# Patient Record
Sex: Male | Born: 1955 | Race: White | Hispanic: No | Marital: Married | Smoking: Former smoker
Health system: Southern US, Community
[De-identification: ages and names within clinical notes are randomized; demographics above are authoritative.]

## PROBLEM LIST (undated history)

## (undated) DIAGNOSIS — I10 Essential (primary) hypertension: Secondary | ICD-10-CM

## (undated) DIAGNOSIS — Z720 Tobacco use: Secondary | ICD-10-CM

## (undated) DIAGNOSIS — I350 Nonrheumatic aortic (valve) stenosis: Secondary | ICD-10-CM

## (undated) DIAGNOSIS — E1129 Type 2 diabetes mellitus with other diabetic kidney complication: Secondary | ICD-10-CM

## (undated) DIAGNOSIS — E7800 Pure hypercholesterolemia, unspecified: Secondary | ICD-10-CM

## (undated) DIAGNOSIS — B351 Tinea unguium: Secondary | ICD-10-CM

## (undated) DIAGNOSIS — F1011 Alcohol abuse, in remission: Secondary | ICD-10-CM

## (undated) DIAGNOSIS — I2510 Atherosclerotic heart disease of native coronary artery without angina pectoris: Secondary | ICD-10-CM

## (undated) HISTORY — DX: Alcohol abuse, in remission: F10.11

## (undated) HISTORY — PX: NO PAST SURGERIES: SHX2092

## (undated) HISTORY — DX: Essential (primary) hypertension: I10

## (undated) HISTORY — DX: Pure hypercholesterolemia, unspecified: E78.00

## (undated) HISTORY — DX: Type 2 diabetes mellitus with other diabetic kidney complication: E11.29

## (undated) HISTORY — DX: Tinea unguium: B35.1

## (undated) HISTORY — DX: Atherosclerotic heart disease of native coronary artery without angina pectoris: I25.10

---

## 2015-10-03 DIAGNOSIS — R5383 Other fatigue: Secondary | ICD-10-CM | POA: Diagnosis not present

## 2015-10-03 DIAGNOSIS — L309 Dermatitis, unspecified: Secondary | ICD-10-CM | POA: Diagnosis not present

## 2015-10-03 DIAGNOSIS — R05 Cough: Secondary | ICD-10-CM | POA: Diagnosis not present

## 2015-10-03 DIAGNOSIS — I10 Essential (primary) hypertension: Secondary | ICD-10-CM | POA: Diagnosis not present

## 2015-10-03 DIAGNOSIS — Z1389 Encounter for screening for other disorder: Secondary | ICD-10-CM | POA: Diagnosis not present

## 2015-10-13 DIAGNOSIS — R7301 Impaired fasting glucose: Secondary | ICD-10-CM | POA: Diagnosis not present

## 2015-11-14 DIAGNOSIS — Z125 Encounter for screening for malignant neoplasm of prostate: Secondary | ICD-10-CM | POA: Diagnosis not present

## 2015-11-14 DIAGNOSIS — E7800 Pure hypercholesterolemia, unspecified: Secondary | ICD-10-CM | POA: Diagnosis not present

## 2015-11-14 DIAGNOSIS — R7301 Impaired fasting glucose: Secondary | ICD-10-CM | POA: Diagnosis not present

## 2016-02-05 DIAGNOSIS — E119 Type 2 diabetes mellitus without complications: Secondary | ICD-10-CM | POA: Diagnosis not present

## 2016-02-09 DIAGNOSIS — Z0000 Encounter for general adult medical examination without abnormal findings: Secondary | ICD-10-CM | POA: Diagnosis not present

## 2016-02-09 DIAGNOSIS — Z2821 Immunization not carried out because of patient refusal: Secondary | ICD-10-CM | POA: Diagnosis not present

## 2016-07-22 DIAGNOSIS — Z6821 Body mass index (BMI) 21.0-21.9, adult: Secondary | ICD-10-CM | POA: Diagnosis not present

## 2016-07-22 DIAGNOSIS — E7439 Other disorders of intestinal carbohydrate absorption: Secondary | ICD-10-CM | POA: Diagnosis not present

## 2016-07-22 DIAGNOSIS — F17200 Nicotine dependence, unspecified, uncomplicated: Secondary | ICD-10-CM | POA: Diagnosis not present

## 2016-07-22 DIAGNOSIS — R899 Unspecified abnormal finding in specimens from other organs, systems and tissues: Secondary | ICD-10-CM | POA: Diagnosis not present

## 2016-07-22 DIAGNOSIS — I10 Essential (primary) hypertension: Secondary | ICD-10-CM | POA: Diagnosis not present

## 2017-02-17 DIAGNOSIS — Z1331 Encounter for screening for depression: Secondary | ICD-10-CM | POA: Diagnosis not present

## 2017-02-17 DIAGNOSIS — Z1339 Encounter for screening examination for other mental health and behavioral disorders: Secondary | ICD-10-CM | POA: Diagnosis not present

## 2017-02-17 DIAGNOSIS — Z0000 Encounter for general adult medical examination without abnormal findings: Secondary | ICD-10-CM | POA: Diagnosis not present

## 2017-02-17 DIAGNOSIS — Z6822 Body mass index (BMI) 22.0-22.9, adult: Secondary | ICD-10-CM | POA: Diagnosis not present

## 2017-02-25 DIAGNOSIS — R899 Unspecified abnormal finding in specimens from other organs, systems and tissues: Secondary | ICD-10-CM | POA: Diagnosis not present

## 2017-02-25 DIAGNOSIS — E119 Type 2 diabetes mellitus without complications: Secondary | ICD-10-CM | POA: Diagnosis not present

## 2017-09-21 DIAGNOSIS — Z01818 Encounter for other preprocedural examination: Secondary | ICD-10-CM | POA: Diagnosis not present

## 2017-10-06 DIAGNOSIS — K5730 Diverticulosis of large intestine without perforation or abscess without bleeding: Secondary | ICD-10-CM | POA: Diagnosis not present

## 2017-10-06 DIAGNOSIS — Z1211 Encounter for screening for malignant neoplasm of colon: Secondary | ICD-10-CM | POA: Diagnosis not present

## 2017-10-06 DIAGNOSIS — Z7982 Long term (current) use of aspirin: Secondary | ICD-10-CM | POA: Diagnosis not present

## 2017-10-06 DIAGNOSIS — D124 Benign neoplasm of descending colon: Secondary | ICD-10-CM | POA: Diagnosis not present

## 2017-10-06 DIAGNOSIS — D128 Benign neoplasm of rectum: Secondary | ICD-10-CM | POA: Diagnosis not present

## 2017-10-06 DIAGNOSIS — D122 Benign neoplasm of ascending colon: Secondary | ICD-10-CM | POA: Diagnosis not present

## 2017-10-06 DIAGNOSIS — K648 Other hemorrhoids: Secondary | ICD-10-CM | POA: Diagnosis not present

## 2017-10-06 DIAGNOSIS — F17210 Nicotine dependence, cigarettes, uncomplicated: Secondary | ICD-10-CM | POA: Diagnosis not present

## 2017-10-06 DIAGNOSIS — I10 Essential (primary) hypertension: Secondary | ICD-10-CM | POA: Diagnosis not present

## 2017-10-06 DIAGNOSIS — Z800 Family history of malignant neoplasm of digestive organs: Secondary | ICD-10-CM | POA: Diagnosis not present

## 2017-10-06 DIAGNOSIS — K635 Polyp of colon: Secondary | ICD-10-CM | POA: Diagnosis not present

## 2018-02-20 DIAGNOSIS — Z0000 Encounter for general adult medical examination without abnormal findings: Secondary | ICD-10-CM | POA: Diagnosis not present

## 2018-02-20 DIAGNOSIS — Z6821 Body mass index (BMI) 21.0-21.9, adult: Secondary | ICD-10-CM | POA: Diagnosis not present

## 2018-02-21 DIAGNOSIS — E119 Type 2 diabetes mellitus without complications: Secondary | ICD-10-CM | POA: Diagnosis not present

## 2018-02-21 DIAGNOSIS — Z0000 Encounter for general adult medical examination without abnormal findings: Secondary | ICD-10-CM | POA: Diagnosis not present

## 2018-03-03 DIAGNOSIS — I10 Essential (primary) hypertension: Secondary | ICD-10-CM | POA: Diagnosis not present

## 2018-03-03 DIAGNOSIS — E1165 Type 2 diabetes mellitus with hyperglycemia: Secondary | ICD-10-CM | POA: Diagnosis not present

## 2018-03-03 DIAGNOSIS — Z1331 Encounter for screening for depression: Secondary | ICD-10-CM | POA: Diagnosis not present

## 2018-03-03 DIAGNOSIS — Z6821 Body mass index (BMI) 21.0-21.9, adult: Secondary | ICD-10-CM | POA: Diagnosis not present

## 2018-05-01 DIAGNOSIS — E1165 Type 2 diabetes mellitus with hyperglycemia: Secondary | ICD-10-CM | POA: Diagnosis not present

## 2018-10-09 DIAGNOSIS — Z79899 Other long term (current) drug therapy: Secondary | ICD-10-CM | POA: Diagnosis not present

## 2018-10-09 DIAGNOSIS — I10 Essential (primary) hypertension: Secondary | ICD-10-CM | POA: Diagnosis not present

## 2018-10-09 DIAGNOSIS — E7800 Pure hypercholesterolemia, unspecified: Secondary | ICD-10-CM | POA: Diagnosis not present

## 2018-10-09 DIAGNOSIS — E7439 Other disorders of intestinal carbohydrate absorption: Secondary | ICD-10-CM | POA: Diagnosis not present

## 2018-10-09 DIAGNOSIS — F17200 Nicotine dependence, unspecified, uncomplicated: Secondary | ICD-10-CM | POA: Diagnosis not present

## 2018-10-09 DIAGNOSIS — Z6820 Body mass index (BMI) 20.0-20.9, adult: Secondary | ICD-10-CM | POA: Diagnosis not present

## 2018-10-09 DIAGNOSIS — L309 Dermatitis, unspecified: Secondary | ICD-10-CM | POA: Diagnosis not present

## 2020-06-23 ENCOUNTER — Encounter: Payer: Self-pay | Admitting: Cardiology

## 2020-07-24 NOTE — Progress Notes (Signed)
Cardiology Office Note:    Date:  07/25/2020   ID:  [name], DOB 06-13-1955, MRN [id number]  PCP:  Robert Saupe, MD  Cardiologist:  Norman Herrlich, MD   Referring MD: Robert Saupe, MD  ASSESSMENT:    1. Aortic valve calcification   2. Coronary artery calcification seen on CT scan   3. Thoracic aorta atherosclerosis (HCC)    PLAN:    In order of problems listed above:  1. Clinically he has a murmur through his adulthood clearly has aortic stenosis and likely a bicuspid aortic valve he is asymptomatic and the physical exam does not have characteristics of severe stenosis.  I have asked him to have an echocardiogram performed to define his valvular pathology severity and also to look at his thoracic aorta.  Unless he had critical aortic stenosis greater than 5 m velocity there will be no indication for intervention at this time. 2. He has had increased cardiovascular risk with ongoing cigarette smoking diabetes hypertension hyperlipidemia and has what is described as severe coronary artery calcification.  He tells me he had a stress test decades ago that was normal.  I think he should have an ischemia evaluation I think he is best served by cardiac CTA.  He understands there is a worldwide dye shortage and does not have a problem waiting until we are out of this timeframe and will be set up electively as an outpatient and he will follow-up with me a few weeks afterwards. 3. His diabetes hypertension hyperlipidemia are well controlled on appropriate cardioprotective medications continue the same.  Next appointment 2 weeks after CTA   Medication Adjustments/Labs and Tests Ordered: Current medicines are reviewed at length with the patient today.  Concerns regarding medicines are outlined above.  No orders of the defined types were placed in this encounter.  No orders of the defined types were placed in this encounter.    I had a screening CT scan for lung cancer and  was found to have a heavily calcified aortic valve and vascular calcification.  History of Present Illness:    Robert Li is a 65 y.o. male with a history of hypertension hyperlipidemia and type 2 diabetes who is being seen today for the evaluation of abnormal lung screening CT of chest at the request of Robert Saupe, MD.  Recent labs from his PCP 06/13/2020 CMP shows GFR greater than 60 cc sodium 138 potassium 4.2 A1c 7.2% hemoglobin normal 15.9 platelets 197,000 Lipid profile 11/23/2019 shows a cholesterol 119 triglycerides 137 HDL 51 on a high intensity statin  Office note from 06/12/2020 reviewed.  He had a CT of the chest lung cancer screening performed 06/18/2020 showing severe calcification of the aortic valve with concerns of aortic stenosis and diffuse coronary and aortic atheroosclerosis as well as findings of emphysema.  He has been very apprehensive pending this evaluation. He tells me has had a heart murmur for about 30 years but no known history of congenital or rheumatic heart disease He is a vigorous active man no angina chest pain shortness of breath palpitation or syncope. Recently wore a 1 week continuous glucose monitor and tells me he is tightly controlled He is on lipid-lowering therapy and on appropriate cardioprotective treatment for diabetes with SGLT2 inhibitor metformin and a ARB for hyper tension. He has no family history of valvular heart disease or aortopathy.  Past Medical History:  Diagnosis Date  . Benign essential hypertension   .  Diabetes mellitus with renal complications (HCC)   . History of alcohol abuse   . Hypercholesterolemia   . Onychomycosis     Past Surgical History:  Procedure Laterality Date  . NO PAST SURGERIES      Current Medications: Current Meds  Medication Sig  . amLODipine (NORVASC) 10 MG tablet Take 5 mg by mouth daily.  Marland Kitchen JARDIANCE 10 MG TABS tablet Take 10 mg by mouth daily.  . metFORMIN (GLUCOPHAGE) 500 MG  tablet Take 1 tablet by mouth 2 (two) times daily.  . rosuvastatin (CRESTOR) 5 MG tablet Take 5 mg by mouth at bedtime.  Marland Kitchen telmisartan (MICARDIS) 40 MG tablet Take 40 mg by mouth daily.     Allergies:   Altace [ramipril], Hctz [hydrochlorothiazide], and Wellbutrin [bupropion]   Social History   Socioeconomic History  . Marital status: Unknown    Spouse name: Not on file  . Number of children: Not on file  . Years of education: Not on file  . Highest education level: Not on file  Occupational History  . Not on file  Tobacco Use  . Smoking status: Current Every Day Smoker    Packs/day: 1.00  . Smokeless tobacco: Never Used  Substance and Sexual Activity  . Alcohol use: Yes    Alcohol/week: 35.0 standard drinks    Types: 35 Cans of beer per week  . Drug use: Never  . Sexual activity: Not on file  Other Topics Concern  . Not on file  Social History Narrative  . Not on file   Social Determinants of Health   Financial Resource Strain: Not on file  Food Insecurity: Not on file  Transportation Needs: Not on file  Physical Activity: Not on file  Stress: Not on file  Social Connections: Not on file     Family History: The patient's family history includes Alzheimer's disease in his mother; Colon cancer in his father; Diabetes in his father; Hypertension in his father.  ROS:   ROS Please see the history of present illness.     All other systems reviewed and are negative.  He does have urinary frequency and nocturia  EKGs/Labs/Other Studies Reviewed:    The following studies were reviewed today:   EKG:  EKG is  ordered today.  The ekg ordered today is personally reviewed and demonstrates sinus rhythm left axis deviation nonspecific T waves no pattern of infarction   Physical Exam:    VS:  BP 130/80 (BP Location: Left Arm, Patient Position: Sitting, Cuff Size: Normal)   Pulse 89   Ht 5\' 8"  (1.727 m)   Wt 139 lb (63 kg)   SpO2 98%   BMI 21.13 kg/m     Wt Readings  from Last 3 Encounters:  07/25/20 139 lb (63 kg)  06/12/20 147 lb (66.7 kg)     GEN: He has mild scoliosis and pectus excavatum deformity well nourished, well developed in no acute distress HEENT: Normal NECK: No JVD; No carotid bruits LYMPHATICS: No lymphadenopathy CARDIAC: He has a grade 2/6 to 3/6 systolic ejection murmur aortic area right clavicle does not radiate to the carotids S2 still splits no aortic regurgitation RRR, no , rubs, gallops RESPIRATORY:  Clear to auscultation without rales, wheezing or rhonchi  ABDOMEN: Soft, non-tender, non-distended MUSCULOSKELETAL:  No edema; No deformity  SKIN: Warm and dry NEUROLOGIC:  Alert and oriented x 3 PSYCHIATRIC:  Normal affect     Signed, 06/14/20, MD  07/25/2020 8:44 AM    [facility]

## 2020-07-25 ENCOUNTER — Encounter: Payer: Self-pay | Admitting: Cardiology

## 2020-07-25 ENCOUNTER — Other Ambulatory Visit: Payer: Self-pay

## 2020-07-25 ENCOUNTER — Ambulatory Visit (INDEPENDENT_AMBULATORY_CARE_PROVIDER_SITE_OTHER): Payer: Medicare Other | Admitting: Cardiology

## 2020-07-25 VITALS — BP 130/80 | HR 89 | Ht 68.0 in | Wt 139.0 lb

## 2020-07-25 DIAGNOSIS — I2510 Atherosclerotic heart disease of native coronary artery without angina pectoris: Secondary | ICD-10-CM

## 2020-07-25 DIAGNOSIS — I359 Nonrheumatic aortic valve disorder, unspecified: Secondary | ICD-10-CM

## 2020-07-25 DIAGNOSIS — I700 Atherosclerosis of aorta: Secondary | ICD-10-CM

## 2020-07-25 MED ORDER — METOPROLOL TARTRATE 100 MG PO TABS: 100.0000 mg | ORAL_TABLET | Freq: Once | ORAL | 0 refills | Status: DC

## 2020-07-25 NOTE — Patient Instructions (Addendum)
Medication Instructions:  Your physician recommends that you continue on your current medications as directed. Please refer to the Current Medication list given to you today.  *If you need a refill on your cardiac medications before your next appointment, please call your pharmacy*   Lab Work: Your physician recommends that you return for lab work in: WITHIN ONE WEEK OF YOUR CARDIAC CT  BMP  If you have labs (blood work) drawn today and your tests are completely normal, you will receive your results only by: Marland Kitchen MyChart Message (if you have MyChart) OR . A paper copy in the mail If you have any lab test that is abnormal or we need to change your treatment, we will call you to review the results.   Testing/Procedures: Your physician has requested that you have an echocardiogram. Echocardiography is a painless test that uses sound waves to create images of your heart. It provides your doctor with information about the size and shape of your heart and how well your heart's chambers and valves are working. This procedure takes approximately one hour. There are no restrictions for this procedure.  Your cardiac CT will be scheduled at the below location:   [facility] [address] [place], [place] [address] [phone]  If scheduled at [facility], please arrive at the [facility] main entrance (entrance A) of [facility] 30 minutes prior to test start time. Proceed to the [facility] Radiology Department (first floor) to check-in and test prep.  Please follow these instructions carefully (unless otherwise directed):  On the Night Before the Test: . Be sure to Drink plenty of water. . Do not consume any caffeinated/decaffeinated beverages or chocolate 12 hours prior to your test. . Do not take any antihistamines 12 hours prior to your test.  On the Day of the Test: . Drink plenty of water until 1 hour prior to the test. . Do not eat any food 4 hours  prior to the test. . You may take your regular medications prior to the test.  . Take metoprolol (Lopressor) two hours prior to test.      After the Test: . Drink plenty of water. . After receiving IV contrast, you may experience a mild flushed feeling. This is normal. . On occasion, you may experience a mild rash up to 24 hours after the test. This is not dangerous. If this occurs, you can take Benadryl 25 mg and increase your fluid intake. . If you experience trouble breathing, this can be serious. If it is severe call 911 IMMEDIATELY. If it is mild, please call our office. . If you take any of these medications: Glipizide/Metformin, Avandament, Glucavance, please do not take 48 hours after completing test unless otherwise instructed.   Once we have confirmed authorization from your insurance company, we will call you to set up a date and time for your test. Based on how quickly your insurance processes prior authorizations requests, please allow up to 4 weeks to be contacted for scheduling your Cardiac CT appointment. Be advised that routine Cardiac CT appointments could be scheduled as many as 8 weeks after your provider has ordered it.  For non-scheduling related questions, please contact the cardiac imaging nurse navigator should you have any questions/concerns: Rockwell Alexandria, Cardiac Imaging Nurse Navigator Larey Brick, Cardiac Imaging Nurse Navigator  Heart and Vascular Services Direct Office Dial: [phone]   For scheduling needs, including cancellations and rescheduling, please call [place], [phone].  Follow-Up: At [facility], you and your health needs are our priority.  As part of our continuing mission to provide you with exceptional heart care, we have created designated Provider Care Teams.  These Care Teams include your primary Cardiologist (physician) and Advanced Practice Providers (APPs -  Physician Assistants and Nurse Practitioners) who all  work together to provide you with the care you need, when you need it.  We recommend signing up for the patient portal called "MyChart".  Sign up information is provided on this After Visit Summary.  MyChart is used to connect with patients for Virtual Visits (Telemedicine).  Patients are able to view lab/test results, encounter notes, upcoming appointments, etc.  Non-urgent messages can be sent to your provider as well.   To learn more about what you can do with MyChart, go to [email or web address].    Your next appointment:   2 week(s) after cardiac CT  The format for your next appointment:   In Person  Provider:   Norman Herrlich, MD   Other Instructions

## 2020-08-08 ENCOUNTER — Telehealth (HOSPITAL_COMMUNITY): Payer: Self-pay | Admitting: Emergency Medicine

## 2020-08-08 ENCOUNTER — Other Ambulatory Visit: Payer: Self-pay

## 2020-08-08 DIAGNOSIS — I359 Nonrheumatic aortic valve disorder, unspecified: Secondary | ICD-10-CM

## 2020-08-08 DIAGNOSIS — I2510 Atherosclerotic heart disease of native coronary artery without angina pectoris: Secondary | ICD-10-CM

## 2020-08-08 DIAGNOSIS — I700 Atherosclerosis of aorta: Secondary | ICD-10-CM

## 2020-08-08 NOTE — Telephone Encounter (Signed)
Reaching out to patient to offer assistance regarding upcoming cardiac imaging study; pt verbalizes understanding of appt date/time, parking situation and where to check in, pre-test NPO status and medications ordered, and verified current allergies; name and call back number provided for further questions should they arise Rockwell Alexandria RN Navigator Cardiac Imaging [facility] [phone] office [phone] cell   100mg  metoprolol tart 2 hr prior to scan Pt reports he had labs drawn at [facility] office today 08/08/20  08/10/20

## 2020-08-09 LAB — BASIC METABOLIC PANEL
BUN/Creatinine Ratio: 19 (ref 10–24)
BUN: 15 mg/dL (ref 8–27)
CO2: 18 mmol/L — ABNORMAL LOW (ref 20–29)
Calcium: 9 mg/dL (ref 8.6–10.2)
Chloride: 103 mmol/L (ref 96–106)
Creatinine, Ser: 0.8 mg/dL (ref 0.76–1.27)
Glucose: 115 mg/dL — ABNORMAL HIGH (ref 65–99)
Potassium: 4 mmol/L (ref 3.5–5.2)
Sodium: 141 mmol/L (ref 134–144)
eGFR: 98 mL/min/{1.73_m2} (ref >59)

## 2020-08-11 ENCOUNTER — Telehealth: Payer: Self-pay

## 2020-08-11 NOTE — Telephone Encounter (Signed)
-----   Message from Brian J Munley, MD sent at 08/10/2020  5:28 PM EDT ----- Normal or stable result  No changes 

## 2020-08-11 NOTE — Telephone Encounter (Signed)
Spoke with patient regarding results and recommendation.  Patient verbalizes understanding and is agreeable to plan of care. Advised patient to call back with any issues or concerns.  

## 2020-08-12 ENCOUNTER — Other Ambulatory Visit: Payer: Self-pay

## 2020-08-12 ENCOUNTER — Ambulatory Visit (HOSPITAL_COMMUNITY)
Admission: RE | Admit: 2020-08-12 | Discharge: 2020-08-12 | Disposition: A | Discharge status: Home / Self Care | Payer: Medicare Other | Source: Ambulatory Visit | Attending: Cardiology | Admitting: Cardiology

## 2020-08-12 DIAGNOSIS — I700 Atherosclerosis of aorta: Secondary | ICD-10-CM | POA: Diagnosis present

## 2020-08-12 DIAGNOSIS — I2510 Atherosclerotic heart disease of native coronary artery without angina pectoris: Secondary | ICD-10-CM | POA: Diagnosis not present

## 2020-08-12 DIAGNOSIS — I359 Nonrheumatic aortic valve disorder, unspecified: Secondary | ICD-10-CM | POA: Diagnosis not present

## 2020-08-12 MED ORDER — NITROGLYCERIN 0.4 MG SL SUBL
0.8000 mg | SUBLINGUAL_TABLET | Freq: Once | SUBLINGUAL | Status: AC
  Administered 2020-08-12: 0.8 mg via SUBLINGUAL

## 2020-08-12 MED ORDER — NITROGLYCERIN 0.4 MG SL SUBL
SUBLINGUAL_TABLET | SUBLINGUAL | Status: AC
  Filled 2020-08-12: qty 2

## 2020-08-12 MED ORDER — IOHEXOL 350 MG/ML SOLN
80.0000 mL | Freq: Once | INTRAVENOUS | Status: AC | PRN
  Administered 2020-08-12: 80 mL via INTRAVENOUS

## 2020-08-14 ENCOUNTER — Telehealth: Payer: Self-pay

## 2020-08-14 NOTE — Telephone Encounter (Signed)
Tried calling patient. No answer and no voicemail set up for me to leave a message. 

## 2020-08-14 NOTE — Telephone Encounter (Signed)
-----   Message from Thomasene Ripple, DO sent at 08/14/2020 10:59 AM EDT ----- This is Dr. Hulen Shouts patient, please schedule patient to see Dr. Dulce Sellar upon his return.

## 2020-08-15 ENCOUNTER — Other Ambulatory Visit: Payer: Self-pay

## 2020-08-15 ENCOUNTER — Ambulatory Visit (INDEPENDENT_AMBULATORY_CARE_PROVIDER_SITE_OTHER): Payer: Medicare Other

## 2020-08-15 ENCOUNTER — Telehealth: Payer: Self-pay

## 2020-08-15 DIAGNOSIS — I2510 Atherosclerotic heart disease of native coronary artery without angina pectoris: Secondary | ICD-10-CM

## 2020-08-15 DIAGNOSIS — I359 Nonrheumatic aortic valve disorder, unspecified: Secondary | ICD-10-CM

## 2020-08-15 DIAGNOSIS — I700 Atherosclerosis of aorta: Secondary | ICD-10-CM

## 2020-08-15 LAB — ECHOCARDIOGRAM COMPLETE
AR max vel: 0.39 cm2
AV Area VTI: 0.48 cm2
AV Area mean vel: 0.35 cm2
AV Mean grad: 87 mmHg
AV Peak grad: 125.9 mmHg
Ao pk vel: 5.61 m/s
Area-P 1/2: 4.99 cm2
S' Lateral: 2.9 cm

## 2020-08-15 NOTE — Telephone Encounter (Signed)
Spoke with patient regarding results and recommendation.  Patient verbalizes understanding and is agreeable to plan of care. Advised patient to call back with any issues or concerns.  

## 2020-08-15 NOTE — Telephone Encounter (Signed)
-----   Message from Kardie Tobb, DO sent at 08/14/2020 10:59 AM EDT ----- This is Dr. Munley's patient, please schedule patient to see Dr. Munley upon his return. 

## 2020-08-18 DIAGNOSIS — I119 Hypertensive heart disease without heart failure: Secondary | ICD-10-CM

## 2020-08-18 DIAGNOSIS — E1129 Type 2 diabetes mellitus with other diabetic kidney complication: Secondary | ICD-10-CM | POA: Insufficient documentation

## 2020-08-18 DIAGNOSIS — F1011 Alcohol abuse, in remission: Secondary | ICD-10-CM | POA: Insufficient documentation

## 2020-08-18 DIAGNOSIS — E7800 Pure hypercholesterolemia, unspecified: Secondary | ICD-10-CM | POA: Insufficient documentation

## 2020-08-18 DIAGNOSIS — B351 Tinea unguium: Secondary | ICD-10-CM | POA: Insufficient documentation

## 2020-08-18 HISTORY — DX: Hypertensive heart disease without heart failure: I11.9

## 2020-08-18 NOTE — Progress Notes (Addendum)
Cardiology Office Note:    Date:  08/26/2020   ID:  Robert Li, DOB [birth year], MRN [id number]  PCP:  Robert Saupe, MD  Cardiologist:  Robert Herrlich, MD    Referring MD: Robert Saupe, MD    ASSESSMENT:    1. Nonrheumatic aortic valve stenosis   2. High coronary artery calcium score   3. Coronary artery disease of native artery of native heart with stable angina pectoris (HCC)   4. Hypertensive heart disease without heart failure   5. Hypercholesterolemia    PLAN:     He phoned my office this morning 08/26/2020 and would like to have his care [facility] and referred for right and left heart catheterization with critical aortic stenosis.   In order of problems listed above:  He has critical aortic stenosis very high calcium score and CAD which appears to be not severe.  I advised him to undergo evaluation right and left heart catheterization in preparation for valvular intervention.  Options benefits and risks detailed.  The family make a decision if they want to be referred to the [facility]  I asked them not to delay. Continue statin with CAD and high calcium score Continue his ARB for hypertension Continue oral agents for diabetes    Next appointment: To be determined if referred to [facility] to follow-up with that organization   Medication Adjustments/Labs and Tests Ordered: Current medicines are reviewed at length with the patient today.  Concerns regarding medicines are outlined above.  No orders of the defined types were placed in this encounter.  No orders of the defined types were placed in this encounter.   Chief Complaint  Patient presents with   Follow-up    After cardiac CTA and echocardiogram revealing critical aortic stenosis     History of Present Illness:    Robert Li is a 65 y.o. male with a hx of hypertension hyperlipidemia type 2 diabetes mellitus coronary and aortic atherosclerosis  emphysema and aortic valve calcification on CT scan last seen 07/25/2020.  Compliance with diet, lifestyle and medications: Yes  His  echocardiogram performed 08/15/2020 showed severe concentric LVH impaired relaxation EF 60 to 65% aortic valve is calcified thickened with severe aortic stenosis with a peak velocity of 5.6 m/s peak and mean gradients of 126 and 87 mmHg.  I independently reviewed the echocardiogram and the valve appears to be bicuspid and he is a lifelong history of heart murmur. Cardiac CTA reported 07/25/2020 showed a bicuspid aortic valve with aortic valve calcium score 5645 coronary artery calcium score also severely elevated 819 and CAD with 1 to 24% ostial left main stenosis 50 to 69% long stenosis ostial LAD circumflex 1 to 24% proximal and right coronary artery 25 to 45% mid vessel.  Echo 08/15/2020:  1. Left ventricular ejection fraction, by estimation, is 60 to 65%. The  left ventricle has normal function. The left ventricle has no regional  wall motion abnormalities. There is severe left ventricular hypertrophy.  Left ventricular diastolic parameters   are consistent with Grade I diastolic dysfunction (impaired relaxation).   2. The mitral valve is normal in structure. No evidence of mitral valve  regurgitation. No evidence of mitral stenosis.   3. Possibility of bicuspid valve cannot be ruled out.. The aortic valve  was not well visualized. Aortic valve regurgitation is not visualized.  Severe aortic valve stenosis.   4. The inferior vena cava is normal in size with  greater than 50%  respiratory variability, suggesting right atrial pressure of 3 mmHg.   Cardiac CTA : IMPRESSION: 1. Calcium score 819 which is 88 th percentile for age and sex 2. Aortic valve calcium score of 5645 suggesting severe aortic stenosis Echo correlation suggested 3. CAD RADS 3 possibly obstructive CAD specifically in ostial LAD trifurcation of IM and circumflex 4. Likely bicuspid AV with  fused non and right cusps suggest Echo correlation for significant AS 5.  Normal aortic root 3.2 cm 6. Significant misregistration artifact in all phases due to breathing not suitable for FFR CT evaluation  His son is present he is a Engineer, civil (consulting) works in a Water quality scientist in [place]. He participates in decision making and evaluation. Although the patient says he is asymptomatic his son is noted exercise intolerance and generalized fatigue with activity.  He has had no chest pain shortness of breath or syncope. Reviewed the results of his cardiac CTA and echocardiogram with the finding of CAD and critical aortic stenosis. Despite being either asymptomatic or minimally symptomatic I strongly encouraged him to undergo further evaluation right and left heart catheterization and consideration of intervention and discussed the options of TAVR or surgical aortic valve replacement. He was a little tearful during the evaluation asked me if he would die and I told him that I think correcting his aortic valve with preserve a good quality of life avoid cardiac problems like heart failure and would be associated with an increased longevity. They have family members who worked at [employer] told me they think they like to go there I told him I be outside of my practice but I would refer him to the structural team and he should follow-up with him afterwards if he has TAVR performed there.  They wanted to go home discussed this as a family and said they would contact me through MyChart in the next few days. Past Medical History:  Diagnosis Date   Benign essential hypertension    Diabetes mellitus with renal complications (HCC)    History of alcohol abuse    Hypercholesterolemia    Onychomycosis     Past Surgical History:  Procedure Laterality Date   NO PAST SURGERIES      Current Medications: Current Meds  Medication Sig   amLODipine (NORVASC) 10 MG tablet Take 5 mg by mouth daily.   JARDIANCE 10 MG TABS tablet  Take 10 mg by mouth daily.   metFORMIN (GLUCOPHAGE) 500 MG tablet Take 1 tablet by mouth 2 (two) times daily.   rosuvastatin (CRESTOR) 5 MG tablet Take 5 mg by mouth at bedtime.   telmisartan (MICARDIS) 40 MG tablet Take 40 mg by mouth daily.     Allergies:   Altace [ramipril], Hctz [hydrochlorothiazide], and Wellbutrin [bupropion]   Social History   Socioeconomic History   Marital status: Unknown    Spouse name: Not on file   Number of children: Not on file   Years of education: Not on file   Highest education level: Not on file  Occupational History   Not on file  Tobacco Use   Smoking status: Every Day    Packs/day: 1.00    Pack years: 0.00    Types: Cigarettes   Smokeless tobacco: Never  Substance and Sexual Activity   Alcohol use: Yes    Alcohol/week: 35.0 standard drinks    Types: 35 Cans of beer per week   Drug use: Never   Sexual activity: Not on file  Other Topics Concern  Not on file  Social History Narrative   Not on file   Social Determinants of Health   Financial Resource Strain: Not on file  Food Insecurity: Not on file  Transportation Needs: Not on file  Physical Activity: Not on file  Stress: Not on file  Social Connections: Not on file     Family History: The patient's family history includes Alzheimer's disease in his mother; Colon cancer in his father; Diabetes in his father; Hypertension in his father. ROS:   Please see the history of present illness.    All other systems reviewed and are negative.  EKGs/Labs/Other Studies Reviewed:    The following studies were reviewed today:   Recent Labs: 08/08/2020: BUN 15; Creatinine, Ser 0.80; Potassium 4.0; Sodium 141  Recent Lipid Panel No results found for: CHOL, TRIG, HDL, CHOLHDL, VLDL, LDLCALC, LDLDIRECT  Physical Exam:    VS:  BP 122/78 (BP Location: Left Arm, Patient Position: Sitting, Cuff Size: Normal)   Pulse 88   Ht 5\' 8"  (1.727 m)   Wt 137 lb 12.8 oz (62.5 kg)   SpO2 98%   BMI  20.95 kg/m     Wt Readings from Last 3 Encounters:  08/19/20 137 lb 12.8 oz (62.5 kg)  07/25/20 139 lb (63 kg)  06/12/20 147 lb (66.7 kg)     GEN:  Well nourished, well developed in no acute distress HEENT: Normal NECK: No JVD; No carotid bruits LYMPHATICS: No lymphadenopathy CARDIAC: Grade 4/6 AAS radiates to the carotids S2 is single RRR, no murmurs, rubs, gallops RESPIRATORY:  Clear to auscultation without rales, wheezing or rhonchi  ABDOMEN: Soft, non-tender, non-distended MUSCULOSKELETAL:  No edema; No deformity  SKIN: Warm and dry NEUROLOGIC:  Alert and oriented x 3 PSYCHIATRIC:  Normal affect    Signed, 6/6, MD  08/26/2020 11:53 AM    [facility]

## 2020-08-18 NOTE — H&P (View-Only) (Signed)
Cardiology Office Note:    Date:  08/26/2020   ID:  Robert Li, DOB [birth year], MRN [id number]  PCP:  Noni Saupe, MD  Cardiologist:  Norman Herrlich, MD    Referring MD: Noni Saupe, MD    ASSESSMENT:    1. Nonrheumatic aortic valve stenosis   2. High coronary artery calcium score   3. Coronary artery disease of native artery of native heart with stable angina pectoris (HCC)   4. Hypertensive heart disease without heart failure   5. Hypercholesterolemia    PLAN:     He phoned my office this morning 08/26/2020 and would like to have his care [facility] and referred for right and left heart catheterization with critical aortic stenosis.   In order of problems listed above:  He has critical aortic stenosis very high calcium score and CAD which appears to be not severe.  I advised him to undergo evaluation right and left heart catheterization in preparation for valvular intervention.  Options benefits and risks detailed.  The family make a decision if they want to be referred to the [facility]  I asked them not to delay. Continue statin with CAD and high calcium score Continue his ARB for hypertension Continue oral agents for diabetes    Next appointment: To be determined if referred to [facility] to follow-up with that organization   Medication Adjustments/Labs and Tests Ordered: Current medicines are reviewed at length with the patient today.  Concerns regarding medicines are outlined above.  No orders of the defined types were placed in this encounter.  No orders of the defined types were placed in this encounter.   Chief Complaint  Patient presents with   Follow-up    After cardiac CTA and echocardiogram revealing critical aortic stenosis     History of Present Illness:    Robert Li is a 65 y.o. male with a hx of hypertension hyperlipidemia type 2 diabetes mellitus coronary and aortic atherosclerosis  emphysema and aortic valve calcification on CT scan last seen 07/25/2020.  Compliance with diet, lifestyle and medications: Yes  His  echocardiogram performed 08/15/2020 showed severe concentric LVH impaired relaxation EF 60 to 65% aortic valve is calcified thickened with severe aortic stenosis with a peak velocity of 5.6 m/s peak and mean gradients of 126 and 87 mmHg.  I independently reviewed the echocardiogram and the valve appears to be bicuspid and he is a lifelong history of heart murmur. Cardiac CTA reported 07/25/2020 showed a bicuspid aortic valve with aortic valve calcium score 5645 coronary artery calcium score also severely elevated 819 and CAD with 1 to 24% ostial left main stenosis 50 to 69% long stenosis ostial LAD circumflex 1 to 24% proximal and right coronary artery 25 to 45% mid vessel.  Echo 08/15/2020:  1. Left ventricular ejection fraction, by estimation, is 60 to 65%. The  left ventricle has normal function. The left ventricle has no regional  wall motion abnormalities. There is severe left ventricular hypertrophy.  Left ventricular diastolic parameters   are consistent with Grade I diastolic dysfunction (impaired relaxation).   2. The mitral valve is normal in structure. No evidence of mitral valve  regurgitation. No evidence of mitral stenosis.   3. Possibility of bicuspid valve cannot be ruled out.. The aortic valve  was not well visualized. Aortic valve regurgitation is not visualized.  Severe aortic valve stenosis.   4. The inferior vena cava is normal in size with  greater than 50%  respiratory variability, suggesting right atrial pressure of 3 mmHg.   Cardiac CTA : IMPRESSION: 1. Calcium score 819 which is 88 th percentile for age and sex 2. Aortic valve calcium score of 5645 suggesting severe aortic stenosis Echo correlation suggested 3. CAD RADS 3 possibly obstructive CAD specifically in ostial LAD trifurcation of IM and circumflex 4. Likely bicuspid AV with  fused non and right cusps suggest Echo correlation for significant AS 5.  Normal aortic root 3.2 cm 6. Significant misregistration artifact in all phases due to breathing not suitable for FFR CT evaluation  His son is present he is a Engineer, civil (consulting) works in a Water quality scientist in [place]. He participates in decision making and evaluation. Although the patient says he is asymptomatic his son is noted exercise intolerance and generalized fatigue with activity.  He has had no chest pain shortness of breath or syncope. Reviewed the results of his cardiac CTA and echocardiogram with the finding of CAD and critical aortic stenosis. Despite being either asymptomatic or minimally symptomatic I strongly encouraged him to undergo further evaluation right and left heart catheterization and consideration of intervention and discussed the options of TAVR or surgical aortic valve replacement. He was a little tearful during the evaluation asked me if he would die and I told him that I think correcting his aortic valve with preserve a good quality of life avoid cardiac problems like heart failure and would be associated with an increased longevity. They have family members who worked at [employer] told me they think they like to go there I told him I be outside of my practice but I would refer him to the structural team and he should follow-up with him afterwards if he has TAVR performed there.  They wanted to go home discussed this as a family and said they would contact me through MyChart in the next few days. Past Medical History:  Diagnosis Date   Benign essential hypertension    Diabetes mellitus with renal complications (HCC)    History of alcohol abuse    Hypercholesterolemia    Onychomycosis     Past Surgical History:  Procedure Laterality Date   NO PAST SURGERIES      Current Medications: Current Meds  Medication Sig   amLODipine (NORVASC) 10 MG tablet Take 5 mg by mouth daily.   JARDIANCE 10 MG TABS tablet  Take 10 mg by mouth daily.   metFORMIN (GLUCOPHAGE) 500 MG tablet Take 1 tablet by mouth 2 (two) times daily.   rosuvastatin (CRESTOR) 5 MG tablet Take 5 mg by mouth at bedtime.   telmisartan (MICARDIS) 40 MG tablet Take 40 mg by mouth daily.     Allergies:   Altace [ramipril], Hctz [hydrochlorothiazide], and Wellbutrin [bupropion]   Social History   Socioeconomic History   Marital status: Unknown    Spouse name: Not on file   Number of children: Not on file   Years of education: Not on file   Highest education level: Not on file  Occupational History   Not on file  Tobacco Use   Smoking status: Every Day    Packs/day: 1.00    Pack years: 0.00    Types: Cigarettes   Smokeless tobacco: Never  Substance and Sexual Activity   Alcohol use: Yes    Alcohol/week: 35.0 standard drinks    Types: 35 Cans of beer per week   Drug use: Never   Sexual activity: Not on file  Other Topics Concern  Not on file  Social History Narrative   Not on file   Social Determinants of Health   Financial Resource Strain: Not on file  Food Insecurity: Not on file  Transportation Needs: Not on file  Physical Activity: Not on file  Stress: Not on file  Social Connections: Not on file     Family History: The patient's family history includes Alzheimer's disease in his mother; Colon cancer in his father; Diabetes in his father; Hypertension in his father. ROS:   Please see the history of present illness.    All other systems reviewed and are negative.  EKGs/Labs/Other Studies Reviewed:    The following studies were reviewed today:   Recent Labs: 08/08/2020: BUN 15; Creatinine, Ser 0.80; Potassium 4.0; Sodium 141  Recent Lipid Panel No results found for: CHOL, TRIG, HDL, CHOLHDL, VLDL, LDLCALC, LDLDIRECT  Physical Exam:    VS:  BP 122/78 (BP Location: Left Arm, Patient Position: Sitting, Cuff Size: Normal)   Pulse 88   Ht 5\' 8"  (1.727 m)   Wt 137 lb 12.8 oz (62.5 kg)   SpO2 98%   BMI  20.95 kg/m     Wt Readings from Last 3 Encounters:  08/19/20 137 lb 12.8 oz (62.5 kg)  07/25/20 139 lb (63 kg)  06/12/20 147 lb (66.7 kg)     GEN:  Well nourished, well developed in no acute distress HEENT: Normal NECK: No JVD; No carotid bruits LYMPHATICS: No lymphadenopathy CARDIAC: Grade 4/6 AAS radiates to the carotids S2 is single RRR, no murmurs, rubs, gallops RESPIRATORY:  Clear to auscultation without rales, wheezing or rhonchi  ABDOMEN: Soft, non-tender, non-distended MUSCULOSKELETAL:  No edema; No deformity  SKIN: Warm and dry NEUROLOGIC:  Alert and oriented x 3 PSYCHIATRIC:  Normal affect    Signed, 6/6, MD  08/26/2020 11:53 AM    [facility]

## 2020-08-19 ENCOUNTER — Ambulatory Visit (INDEPENDENT_AMBULATORY_CARE_PROVIDER_SITE_OTHER): Payer: Medicare Other | Admitting: Cardiology

## 2020-08-19 ENCOUNTER — Other Ambulatory Visit: Payer: Self-pay

## 2020-08-19 ENCOUNTER — Encounter: Payer: Self-pay | Admitting: Cardiology

## 2020-08-19 VITALS — BP 122/78 | HR 88 | Ht 68.0 in | Wt 137.8 lb

## 2020-08-19 DIAGNOSIS — R931 Abnormal findings on diagnostic imaging of heart and coronary circulation: Secondary | ICD-10-CM | POA: Diagnosis not present

## 2020-08-19 DIAGNOSIS — I350 Nonrheumatic aortic (valve) stenosis: Secondary | ICD-10-CM

## 2020-08-19 DIAGNOSIS — I119 Hypertensive heart disease without heart failure: Secondary | ICD-10-CM

## 2020-08-19 DIAGNOSIS — I25118 Atherosclerotic heart disease of native coronary artery with other forms of angina pectoris: Secondary | ICD-10-CM

## 2020-08-19 DIAGNOSIS — E7800 Pure hypercholesterolemia, unspecified: Secondary | ICD-10-CM

## 2020-08-19 NOTE — Patient Instructions (Addendum)

## 2020-08-26 ENCOUNTER — Other Ambulatory Visit: Payer: Self-pay

## 2020-08-26 DIAGNOSIS — I119 Hypertensive heart disease without heart failure: Secondary | ICD-10-CM

## 2020-08-26 NOTE — Addendum Note (Signed)
Addended by: Norman Herrlich on: 08/26/2020 11:57 AM   Modules accepted: Orders, SmartSet

## 2020-08-27 ENCOUNTER — Telehealth: Payer: Self-pay | Admitting: Cardiology

## 2020-08-27 ENCOUNTER — Other Ambulatory Visit: Payer: Self-pay

## 2020-08-27 DIAGNOSIS — I119 Hypertensive heart disease without heart failure: Secondary | ICD-10-CM

## 2020-08-27 NOTE — Telephone Encounter (Signed)
Pt would like someone to call him and discuss his procedure occurring on Monday.  Thank you!

## 2020-08-27 NOTE — Telephone Encounter (Signed)
Spoke to the patient just now and he let me know that he spoke with the nurse while he was in the office. He does not have any additional questions.    Encouraged patient to call back with any questions or concerns.

## 2020-08-28 ENCOUNTER — Telehealth: Payer: Self-pay | Admitting: *Deleted

## 2020-08-28 ENCOUNTER — Telehealth: Payer: Self-pay

## 2020-08-28 LAB — BASIC METABOLIC PANEL
BUN/Creatinine Ratio: 17 (ref 10–24)
BUN: 14 mg/dL (ref 8–27)
CO2: 17 mmol/L — ABNORMAL LOW (ref 20–29)
Calcium: 9 mg/dL (ref 8.6–10.2)
Chloride: 103 mmol/L (ref 96–106)
Creatinine, Ser: 0.84 mg/dL (ref 0.76–1.27)
Glucose: 123 mg/dL — ABNORMAL HIGH (ref 65–99)
Potassium: 4.1 mmol/L (ref 3.5–5.2)
Sodium: 138 mmol/L (ref 134–144)
eGFR: 96 mL/min/{1.73_m2} (ref >59)

## 2020-08-28 LAB — CBC
Hematocrit: 47.9 % (ref 37.5–51.0)
Hemoglobin: 16.6 g/dL (ref 13.0–17.7)
MCH: 32.9 pg (ref 26.6–33.0)
MCHC: 34.7 g/dL (ref 31.5–35.7)
MCV: 95 fL (ref 79–97)
Platelets: 156 10*3/uL (ref 150–450)
RBC: 5.04 x10E6/uL (ref 4.14–5.80)
RDW: 12.4 % (ref 11.6–15.4)
WBC: 6.5 10*3/uL (ref 3.4–10.8)

## 2020-08-28 NOTE — Telephone Encounter (Addendum)
Pt contacted pre-catheterization scheduled at [facility] for: Monday September 01, 2020 7:30 AM Verified arrival time and place: [facility] Main Entrance [facility]) at: 5:30 AM   No solid food after midnight prior to cath, clear liquids until 5 AM day of procedure.  Hold: Jardiance -AM of procedure Metformin-day of procedure and 48 hours post procedure.  Except hold medications AM meds can be  taken pre-cath with sips of water including: aspirin 81 mg   Confirmed patient has responsible adult to drive home post procedure and be with patient first 24 hours after arriving home: yes  You are allowed ONE visitor in the waiting room during the time you are at the hospital for your procedure. Both you and your visitor must wear a mask once you enter the hospital.   Patient reports does not currently have any symptoms concerning for COVID-19 and no household members with COVID-19 like illness.      Reviewed procedure/mask/visitor instructions with patient.

## 2020-08-28 NOTE — Telephone Encounter (Signed)
Tried calling patient. No answer and no voicemail set up for me to leave a message. 

## 2020-08-28 NOTE — Telephone Encounter (Signed)
-----   Message from Baldo Daub, MD sent at 08/28/2020  7:28 AM EDT ----- Normal or stable result  Pre cath

## 2020-09-01 ENCOUNTER — Other Ambulatory Visit: Payer: Self-pay

## 2020-09-01 ENCOUNTER — Ambulatory Visit (HOSPITAL_COMMUNITY)
Admission: RE | Admit: 2020-09-01 | Discharge: 2020-09-01 | Disposition: A | Discharge status: Home / Self Care | Payer: Medicare Other | Attending: Cardiovascular Disease | Admitting: Cardiovascular Disease

## 2020-09-01 ENCOUNTER — Encounter (HOSPITAL_COMMUNITY): Payer: Self-pay | Admitting: Cardiovascular Disease

## 2020-09-01 ENCOUNTER — Encounter (HOSPITAL_COMMUNITY)
Admission: RE | Disposition: A | Discharge status: Home / Self Care | Payer: Self-pay | Source: Home / Self Care | Attending: Cardiovascular Disease

## 2020-09-01 DIAGNOSIS — I25118 Atherosclerotic heart disease of native coronary artery with other forms of angina pectoris: Secondary | ICD-10-CM

## 2020-09-01 DIAGNOSIS — I350 Nonrheumatic aortic (valve) stenosis: Secondary | ICD-10-CM | POA: Diagnosis present

## 2020-09-01 DIAGNOSIS — Z79899 Other long term (current) drug therapy: Secondary | ICD-10-CM | POA: Insufficient documentation

## 2020-09-01 DIAGNOSIS — I2510 Atherosclerotic heart disease of native coronary artery without angina pectoris: Secondary | ICD-10-CM | POA: Diagnosis not present

## 2020-09-01 DIAGNOSIS — E119 Type 2 diabetes mellitus without complications: Secondary | ICD-10-CM | POA: Diagnosis not present

## 2020-09-01 DIAGNOSIS — Z7984 Long term (current) use of oral hypoglycemic drugs: Secondary | ICD-10-CM | POA: Diagnosis not present

## 2020-09-01 DIAGNOSIS — E7800 Pure hypercholesterolemia, unspecified: Secondary | ICD-10-CM

## 2020-09-01 DIAGNOSIS — F17210 Nicotine dependence, cigarettes, uncomplicated: Secondary | ICD-10-CM | POA: Diagnosis not present

## 2020-09-01 DIAGNOSIS — Z888 Allergy status to other drugs, medicaments and biological substances status: Secondary | ICD-10-CM | POA: Insufficient documentation

## 2020-09-01 DIAGNOSIS — I119 Hypertensive heart disease without heart failure: Secondary | ICD-10-CM | POA: Insufficient documentation

## 2020-09-01 DIAGNOSIS — R931 Abnormal findings on diagnostic imaging of heart and coronary circulation: Secondary | ICD-10-CM

## 2020-09-01 HISTORY — PX: RIGHT HEART CATH AND CORONARY/GRAFT ANGIOGRAPHY: CATH118265

## 2020-09-01 LAB — GLUCOSE, CAPILLARY
Glucose-Capillary: 131 mg/dL — ABNORMAL HIGH (ref 70–99)
Glucose-Capillary: 111 mg/dL — ABNORMAL HIGH (ref 70–99)

## 2020-09-01 SURGERY — RIGHT HEART CATH AND CORONARY/GRAFT ANGIOGRAPHY
Anesthesia: LOCAL

## 2020-09-01 MED ORDER — IOHEXOL 350 MG/ML SOLN
INTRAVENOUS | Status: DC | PRN
  Administered 2020-09-01: 55 mL

## 2020-09-01 MED ORDER — LIDOCAINE HCL (PF) 1 % IJ SOLN
INTRAMUSCULAR | Status: AC
  Filled 2020-09-01: qty 30

## 2020-09-01 MED ORDER — FENTANYL CITRATE (PF) 100 MCG/2ML IJ SOLN
INTRAMUSCULAR | Status: AC
  Filled 2020-09-01: qty 2

## 2020-09-01 MED ORDER — SODIUM CHLORIDE 0.9% FLUSH: 3.0000 mL | Freq: Two times a day (BID) | INTRAVENOUS | Status: DC

## 2020-09-01 MED ORDER — HEPARIN SODIUM (PORCINE) 1000 UNIT/ML IJ SOLN
INTRAMUSCULAR | Status: DC | PRN
  Administered 2020-09-01: 3500 [IU] via INTRAVENOUS

## 2020-09-01 MED ORDER — FENTANYL CITRATE (PF) 100 MCG/2ML IJ SOLN
INTRAMUSCULAR | Status: DC | PRN
  Administered 2020-09-01: 50 ug via INTRAVENOUS
  Administered 2020-09-01: 25 ug via INTRAVENOUS

## 2020-09-01 MED ORDER — HEPARIN SODIUM (PORCINE) 1000 UNIT/ML IJ SOLN
INTRAMUSCULAR | Status: AC
  Filled 2020-09-01: qty 1

## 2020-09-01 MED ORDER — VERAPAMIL HCL 2.5 MG/ML IV SOLN
INTRAVENOUS | Status: DC | PRN
  Administered 2020-09-01: 10 mL via INTRA_ARTERIAL

## 2020-09-01 MED ORDER — SODIUM CHLORIDE 0.9 % WEIGHT BASED INFUSION
3.0000 mL/kg/h | INTRAVENOUS | Status: AC
  Administered 2020-09-01: 3 mL/kg/h via INTRAVENOUS

## 2020-09-01 MED ORDER — SODIUM CHLORIDE 0.9 % IV SOLN: 250.0000 mL | INTRAVENOUS | Status: DC | PRN

## 2020-09-01 MED ORDER — SODIUM CHLORIDE 0.9 % IV SOLN: INTRAVENOUS | Status: AC

## 2020-09-01 MED ORDER — MIDAZOLAM HCL 2 MG/2ML IJ SOLN
INTRAMUSCULAR | Status: AC
  Filled 2020-09-01: qty 2

## 2020-09-01 MED ORDER — MIDAZOLAM HCL 2 MG/2ML IJ SOLN
INTRAMUSCULAR | Status: DC | PRN
  Administered 2020-09-01: 2 mg via INTRAVENOUS
  Administered 2020-09-01: 1 mg via INTRAVENOUS

## 2020-09-01 MED ORDER — VERAPAMIL HCL 2.5 MG/ML IV SOLN
INTRAVENOUS | Status: AC
  Filled 2020-09-01: qty 2

## 2020-09-01 MED ORDER — HEPARIN (PORCINE) IN NACL 1000-0.9 UT/500ML-% IV SOLN
INTRAVENOUS | Status: AC
  Filled 2020-09-01: qty 1000

## 2020-09-01 MED ORDER — LABETALOL HCL 5 MG/ML IV SOLN: 10.0000 mg | INTRAVENOUS | Status: DC | PRN

## 2020-09-01 MED ORDER — HEPARIN (PORCINE) IN NACL 1000-0.9 UT/500ML-% IV SOLN
INTRAVENOUS | Status: DC | PRN
  Administered 2020-09-01 (×2): 500 mL

## 2020-09-01 MED ORDER — SODIUM CHLORIDE 0.9 % WEIGHT BASED INFUSION
1.0000 mL/kg/h | INTRAVENOUS | Status: DC
Start: 2020-09-01 — End: 2020-09-01

## 2020-09-01 MED ORDER — ONDANSETRON HCL 4 MG/2ML IJ SOLN: 4.0000 mg | Freq: Four times a day (QID) | INTRAMUSCULAR | Status: DC | PRN

## 2020-09-01 MED ORDER — LIDOCAINE HCL (PF) 1 % IJ SOLN
INTRAMUSCULAR | Status: DC | PRN
  Administered 2020-09-01: 10 mL
  Administered 2020-09-01 (×2): 2 mL

## 2020-09-01 MED ORDER — ACETAMINOPHEN 325 MG PO TABS: 650.0000 mg | ORAL_TABLET | ORAL | Status: DC | PRN

## 2020-09-01 MED ORDER — ASPIRIN 81 MG PO CHEW: 81.0000 mg | CHEWABLE_TABLET | ORAL | Status: DC

## 2020-09-01 MED ORDER — SODIUM CHLORIDE 0.9% FLUSH: 3.0000 mL | INTRAVENOUS | Status: DC | PRN

## 2020-09-01 MED ORDER — HYDRALAZINE HCL 20 MG/ML IJ SOLN: 10.0000 mg | INTRAMUSCULAR | Status: DC | PRN

## 2020-09-01 SURGICAL SUPPLY — 19 items
CATH 5FR JL3.5 JR4 ANG PIG MP (CATHETERS) ×2 IMPLANT
CATH BALLN WEDGE 5F 110CM (CATHETERS) IMPLANT
CATH INFINITI 5FR AL1 (CATHETERS) ×2 IMPLANT
CATH SWAN GANZ VIP 7.5F (CATHETERS) ×2 IMPLANT
DEVICE RAD COMP TR BAND LRG (VASCULAR PRODUCTS) IMPLANT
DEVICE RAD TR BAND REGULAR (VASCULAR PRODUCTS) ×2 IMPLANT
GLIDESHEATH SLEND SS 6F .021 (SHEATH) ×2 IMPLANT
KIT HEART LEFT (KITS) ×2 IMPLANT
PACK CARDIAC CATHETERIZATION (CUSTOM PROCEDURE TRAY) ×2 IMPLANT
SHEATH GLIDE SLENDER 4/5FR (SHEATH) ×2 IMPLANT
SHEATH PINNACLE 7F 10CM (SHEATH) ×2 IMPLANT
TRANSDUCER W/STOPCOCK (MISCELLANEOUS) ×2 IMPLANT
TUBING ART PRESS 72  MALE/FEM (TUBING) ×1
TUBING ART PRESS 72 MALE/FEM (TUBING) ×1 IMPLANT
TUBING CIL FLEX 10 FLL-RA (TUBING) ×2 IMPLANT
WIRE EMERALD 3MM-J .035X150CM (WIRE) ×2 IMPLANT
WIRE EMERALD ST .035X150CM (WIRE) ×2 IMPLANT
WIRE HI TORQ VERSACORE J 260CM (WIRE) ×2 IMPLANT
WIRE MICROINTRODUCER 60CM (WIRE) ×2 IMPLANT

## 2020-09-01 NOTE — Interval H&P Note (Signed)
History and Physical Interval Note:  09/01/2020 7:24 AM  Robert Li  has presented today for surgery, with the diagnosis of critial aortic stenosis.  The various methods of treatment have been discussed with the patient and family. After consideration of risks, benefits and other options for treatment, the patient has consented to  Procedure(s): RIGHT/LEFT HEART CATH AND CORONARY ANGIOGRAPHY (N/A) as a surgical intervention.  The patient's history has been reviewed, patient examined, no change in status, stable for surgery.  I have reviewed the patient's chart and labs.  Questions were answered to the patient's satisfaction.    Cath Lab Visit (complete for each Cath Lab visit)  Clinical Evaluation Leading to the Procedure:   ACS: No.  Non-ACS:    Anginal Classification: No Symptoms  Anti-ischemic medical therapy: Minimal Therapy (1 class of medications)  Non-Invasive Test Results: No non-invasive testing performed  Prior CABG: No previous CABG        Verne Carrow

## 2020-09-01 NOTE — Progress Notes (Signed)
SITE AREA: right groin/femoral  SITE PRIOR TO REMOVAL:  LEVEL 0  PRESSURE APPLIED FOR: approximately 10 minutes  MANUAL: yes  PATIENT STATUS DURING PULL: stable  POST PULL SITE:  LEVEL 0  POST PULL INSTRUCTIONS GIVEN: yes  POST PULL PULSES PRESENT: bilateral pedal pulses at +2  DRESSING APPLIED: gauze with tegaderm  BEDREST BEGINS @ 0911  COMMENTS:

## 2020-09-01 NOTE — Discharge Instructions (Signed)
Radial Site Care  This sheet gives you information about how to care for yourself after your procedure. Your health care provider may also give you more specific instructions. If you have problems or questions, contact your health care provider. What can I expect after the procedure? After the procedure, it is common to have: Bruising and tenderness at the catheter insertion area. Follow these instructions at home: Medicines Take over-the-counter and prescription medicines only as told by your health care provider. Insertion site care Follow instructions from your health care provider about how to take care of your insertion site. Make sure you: Wash your hands with soap and water before you remove your bandage (dressing). If soap and water are not available, use hand sanitizer. May remove dressing in 24 hours. Check your insertion site every day for signs of infection. Check for: Redness, swelling, or pain. Fluid or blood. Pus or a bad smell. Warmth. Do no take baths, swim, or use a hot tub for 5 days. You may shower 24-48 hours after the procedure. Remove the dressing and gently wash the site with plain soap and water. Pat the area dry with a clean towel. Do not rub the site. That could cause bleeding. Do not apply powder or lotion to the site. Activity  For 24 hours after the procedure, or as directed by your health care provider: Do not flex or bend the affected arm. Do not push or pull heavy objects with the affected arm. Do not drive yourself home from the hospital or clinic. You may drive 24 hours after the procedure. Do not operate machinery or power tools. KEEP ARM ELEVATED THE REMAINDER OF THE DAY. Do not push, pull or lift anything that is heavier than 10 lb for 5 days. Ask your health care provider when it is okay to: Return to work or school. Resume usual physical activities or sports. Resume sexual activity. General instructions If the catheter site starts to  bleed, raise your arm and put firm pressure on the site. If the bleeding does not stop, get help right away. This is a medical emergency. DRINK PLENTY OF FLUIDS FOR THE NEXT 2-3 DAYS. No alcohol consumption for 24 hours after receiving sedation. If you went home on the same day as your procedure, a responsible adult should be with you for the first 24 hours after you arrive home. Keep all follow-up visits as told by your health care provider. This is important. Contact a health care provider if: You have a fever. You have redness, swelling, or yellow drainage around your insertion site. Get help right away if: You have unusual pain at the radial site. The catheter insertion area swells very fast. The insertion area is bleeding, and the bleeding does not stop when you hold steady pressure on the area. Your arm or hand becomes pale, cool, tingly, or numb. These symptoms may represent a serious problem that is an emergency. Do not wait to see if the symptoms will go away. Get medical help right away. Call your local emergency services (911 in the [place] Do not drive yourself to the hospital. Summary After the procedure, it is common to have bruising and tenderness at the site. Follow instructions from your health care provider about how to take care of your radial site wound. Check the wound every day for signs of infection.  This information is not intended to replace advice given to you by your health care provider. Make sure you discuss any questions you have with   your health care provider. Document Revised: 03/16/2017 Document Reviewed: 03/16/2017 Elsevier Patient Education  2020 [employer]  

## 2020-09-01 NOTE — Progress Notes (Signed)
Pt ambulated without difficulty or bleeding.   Discharged home with his wife who will drive and stay with pt x 24 hrs. 

## 2020-09-01 NOTE — Research (Signed)
IDENTIFY Informed Consent   Subject Name: Robert Li  Subject met inclusion and exclusion criteria.  The informed consent form, study requirements and expectations were reviewed with the subject and questions and concerns were addressed prior to the signing of the consent form.  The subject verbalized understanding of the trail requirements.  The subject agreed to participate in the IDENTIFY trial and signed the informed consent.  The informed consent was obtained prior to performance of any protocol-specific procedures for the subject.  A copy of the signed informed consent was given to the subject and a copy was placed in the subject's medical record.  LUTTERLOH, KIMBERLY D 09/01/2020, 0659am  

## 2020-09-02 ENCOUNTER — Other Ambulatory Visit: Payer: Self-pay

## 2020-09-02 DIAGNOSIS — I350 Nonrheumatic aortic (valve) stenosis: Secondary | ICD-10-CM

## 2020-09-02 LAB — POCT I-STAT EG7
Acid-base deficit: 6 mmol/L — ABNORMAL HIGH (ref 0.0–2.0)
Bicarbonate: 19.6 mmol/L — ABNORMAL LOW (ref 20.0–28.0)
Calcium, Ion: 1.06 mmol/L — ABNORMAL LOW (ref 1.15–1.40)
HCT: 44 % (ref 39.0–52.0)
Hemoglobin: 15 g/dL (ref 13.0–17.0)
O2 Saturation: 75 %
Potassium: 3.4 mmol/L — ABNORMAL LOW (ref 3.5–5.1)
Sodium: 143 mmol/L (ref 135–145)
TCO2: 21 mmol/L — ABNORMAL LOW (ref 22–32)
pCO2, Ven: 36.6 mmHg — ABNORMAL LOW (ref 44.0–60.0)
pH, Ven: 7.337 (ref 7.250–7.430)
pO2, Ven: 42 mmHg (ref 32.0–45.0)

## 2020-09-02 LAB — POCT I-STAT 7, (LYTES, BLD GAS, ICA,H+H)
Acid-base deficit: 6 mmol/L — ABNORMAL HIGH (ref 0.0–2.0)
Bicarbonate: 19.2 mmol/L — ABNORMAL LOW (ref 20.0–28.0)
Calcium, Ion: 1.09 mmol/L — ABNORMAL LOW (ref 1.15–1.40)
HCT: 45 % (ref 39.0–52.0)
Hemoglobin: 15.3 g/dL (ref 13.0–17.0)
O2 Saturation: 98 %
Potassium: 3.5 mmol/L (ref 3.5–5.1)
Sodium: 141 mmol/L (ref 135–145)
TCO2: 20 mmol/L — ABNORMAL LOW (ref 22–32)
pCO2 arterial: 35.5 mmHg (ref 32.0–48.0)
pH, Arterial: 7.342 — ABNORMAL LOW (ref 7.350–7.450)
pO2, Arterial: 107 mmHg (ref 83.0–108.0)

## 2020-09-02 MED ORDER — METOPROLOL TARTRATE 100 MG PO TABS: ORAL_TABLET | ORAL | 0 refills | Status: DC

## 2020-09-08 ENCOUNTER — Encounter: Payer: Self-pay | Admitting: Physician Assistant

## 2020-09-08 ENCOUNTER — Ambulatory Visit (HOSPITAL_BASED_OUTPATIENT_CLINIC_OR_DEPARTMENT_OTHER)
Admission: RE | Admit: 2020-09-08 | Discharge: 2020-09-08 | Disposition: A | Discharge status: Home / Self Care | Payer: Medicare Other | Source: Ambulatory Visit | Attending: Cardiovascular Disease | Admitting: Cardiovascular Disease

## 2020-09-08 ENCOUNTER — Encounter (HOSPITAL_COMMUNITY): Payer: Self-pay

## 2020-09-08 ENCOUNTER — Ambulatory Visit (HOSPITAL_COMMUNITY): Payer: Medicare Other

## 2020-09-08 ENCOUNTER — Ambulatory Visit (HOSPITAL_COMMUNITY)
Admission: RE | Admit: 2020-09-08 | Discharge: 2020-09-08 | Disposition: A | Discharge status: Home / Self Care | Payer: Medicare Other | Source: Ambulatory Visit | Attending: Cardiovascular Disease | Admitting: Cardiovascular Disease

## 2020-09-08 DIAGNOSIS — Z720 Tobacco use: Secondary | ICD-10-CM | POA: Insufficient documentation

## 2020-09-08 DIAGNOSIS — I350 Nonrheumatic aortic (valve) stenosis: Secondary | ICD-10-CM

## 2020-09-08 DIAGNOSIS — I10 Essential (primary) hypertension: Secondary | ICD-10-CM | POA: Insufficient documentation

## 2020-09-08 HISTORY — DX: Nonrheumatic aortic (valve) stenosis: I35.0

## 2020-09-08 HISTORY — DX: Tobacco use: Z72.0

## 2020-09-08 MED ORDER — METOPROLOL TARTRATE 5 MG/5ML IV SOLN
INTRAVENOUS | Status: AC
  Administered 2020-09-08: 10 mg via INTRAVENOUS
  Filled 2020-09-08: qty 20

## 2020-09-08 MED ORDER — IOHEXOL 350 MG/ML SOLN
95.0000 mL | Freq: Once | INTRAVENOUS | Status: AC | PRN
  Administered 2020-09-08: 95 mL via INTRAVENOUS

## 2020-09-08 MED ORDER — METOPROLOL TARTRATE 5 MG/5ML IV SOLN: 10.0000 mg | INTRAVENOUS | Status: DC | PRN

## 2020-09-08 NOTE — Progress Notes (Signed)
Carotid duplex has been completed.   Preliminary results in CV Proc.   Blanch Media 09/08/2020 8:20 AM

## 2020-09-09 ENCOUNTER — Institutional Professional Consult (permissible substitution) (INDEPENDENT_AMBULATORY_CARE_PROVIDER_SITE_OTHER): Payer: Medicare Other | Admitting: Surgery

## 2020-09-09 ENCOUNTER — Other Ambulatory Visit: Payer: Self-pay

## 2020-09-09 ENCOUNTER — Encounter: Payer: Self-pay | Admitting: Surgery

## 2020-09-09 VITALS — BP 107/74 | HR 90 | Resp 20 | Ht 67.0 in | Wt 135.0 lb

## 2020-09-09 DIAGNOSIS — I350 Nonrheumatic aortic (valve) stenosis: Secondary | ICD-10-CM | POA: Diagnosis not present

## 2020-09-09 DIAGNOSIS — I2510 Atherosclerotic heart disease of native coronary artery without angina pectoris: Secondary | ICD-10-CM

## 2020-09-09 NOTE — Progress Notes (Signed)
Patient ID: Robert Li, male   DOB: 05/24/1955, 65 y.o.   MRN: [id number]  [facility]        [address]       Robert Li,[place] [id number]             [phone]          CARDIOTHORACIC SURGERY CONSULTATION REPORT  PCP is Jeanie Seweredding, Valrie HartJohn F. II, MD Referring Provider is Verne Carrowhristopher McAlhany, MD Primary Cardiologist is Norman HerrlichBrian Munley, MD  Reason for consultation:  Critical aortic stenosis  HPI:  The patient is a 65 year old gentleman with history of hypertension, diabetes, hypercholesterolemia, heavy smoking, alcohol abuse, and bicuspid aortic valve stenosis recently diagnosed on a lung cancer screening CT which showed heavy aortic valve calcification.  2D echocardiogram on 08/15/2020 showed a heavily calcified aortic valve with a mean gradient of 87 mmHg and a peak gradient of 126 mmHg.  Aortic valve area by VTI was 0.48 cm.  Left ventricular ejection fraction was 60 to 65% with severe LVH.  Cardiac catheterization on 09/01/2020 showed mild nonobstructive coronary disease in the proximal RCA and LAD.  Right heart pressures were normal.  The patient is here today with his wife.  He denies any symptoms of exertional shortness of breath or chest discomfort.  He denies fatigue.  He has had no dizziness or syncope.  He does report when he push mows his grass that he develops left calf tightness after a couple trips around his yard.  This resolves quickly with rest.  His wife reports that he frequently takes naps later in the afternoon.  His son is a Engineer, civil (consulting)nurse and works in a Armed forces operational officer[employer] practicing [place].  He is not here today but according the chart he has noticed some exercise intolerance.  Past Medical History:  Diagnosis Date   Benign essential hypertension    Diabetes mellitus with renal complications (HCC)    History of alcohol abuse    Hypercholesterolemia    Onychomycosis    Severe aortic stenosis    Tobacco abuse     Past  Surgical History:  Procedure Laterality Date   NO PAST SURGERIES     RIGHT HEART CATH AND CORONARY/GRAFT ANGIOGRAPHY N/A 09/01/2020   Procedure: RIGHT HEART CATH AND CORONARY/GRAFT ANGIOGRAPHY;  Surgeon: Kathleene HazelMcAlhany, Christopher D, MD;  Location: [facility];  Service: Cardiovascular;  Laterality: N/A;    Family History  Problem Relation Age of Onset   Alzheimer's disease Mother    Colon cancer Father    Diabetes Father    Hypertension Father     Social History   Socioeconomic History   Marital status: Unknown    Spouse name: Not on file   Number of children: Not on file   Years of education: Not on file   Highest education level: Not on file  Occupational History   Not on file  Tobacco Use   Smoking status: Every Day    Packs/day: 1.00    Types: Cigarettes   Smokeless tobacco: Never  Substance and Sexual Activity   Alcohol use: Yes    Alcohol/week: 35.0 standard drinks    Types: 35 Cans of beer per week   Drug use: Never   Sexual activity: Not on file  Other Topics Concern   Not on file  Social History Narrative   Not on file   Social Determinants of Health   Financial Resource Strain: Not on file  Food Insecurity:  Not on file  Transportation Needs: Not on file  Physical Activity: Not on file  Stress: Not on file  Social Connections: Not on file  Intimate Partner Violence: Not on file    Prior to Admission medications   Medication Sig Start Date End Date Taking? Authorizing Provider  amLODipine (NORVASC) 10 MG tablet Take 5 mg by mouth 2 (two) times daily. 06/12/20  Yes [provider]  aspirin EC 81 MG tablet Take 81 mg by mouth daily. Swallow whole.   Yes [provider]  CINNAMON PO Take 1 capsule by mouth daily.   Yes [provider]  JARDIANCE 10 MG TABS tablet Take 10 mg by mouth daily. 07/17/20  Yes [provider]  metFORMIN (GLUCOPHAGE) 500 MG tablet Take 500 mg by mouth 2 (two) times daily. 07/02/20  Yes  [provider]  metoprolol tartrate (LOPRESSOR) 100 MG tablet Take as directed on 7/18 prior to CT scans 09/02/20  Yes Kathleene Hazel, MD  Multiple Vitamin (MULTIVITAMIN WITH MINERALS) TABS tablet Take 1 tablet by mouth every other day.   Yes [provider]  rosuvastatin (CRESTOR) 5 MG tablet Take 5 mg by mouth at bedtime. 06/22/20  Yes [provider]  telmisartan (MICARDIS) 40 MG tablet Take 40 mg by mouth daily. 06/12/20  Yes [provider]  triamcinolone cream (KENALOG) 0.1 % Apply 1 application topically 2 (two) times daily as needed (itching/rash).   Yes [provider]    Current Outpatient Medications  Medication Sig Dispense Refill   amLODipine (NORVASC) 10 MG tablet Take 5 mg by mouth 2 (two) times daily.     aspirin EC 81 MG tablet Take 81 mg by mouth daily. Swallow whole.     CINNAMON PO Take 1 capsule by mouth daily.     JARDIANCE 10 MG TABS tablet Take 10 mg by mouth daily.     metFORMIN (GLUCOPHAGE) 500 MG tablet Take 500 mg by mouth 2 (two) times daily.     metoprolol tartrate (LOPRESSOR) 100 MG tablet Take as directed on 7/18 prior to CT scans 1 tablet 0   Multiple Vitamin (MULTIVITAMIN WITH MINERALS) TABS tablet Take 1 tablet by mouth every other day.     rosuvastatin (CRESTOR) 5 MG tablet Take 5 mg by mouth at bedtime.     telmisartan (MICARDIS) 40 MG tablet Take 40 mg by mouth daily.     triamcinolone cream (KENALOG) 0.1 % Apply 1 application topically 2 (two) times daily as needed (itching/rash).     No current facility-administered medications for this visit.    Allergies  Allergen Reactions   Altace [Ramipril] Cough   Hctz [Hydrochlorothiazide]     Unknown reaction   Wellbutrin [Bupropion] Rash      Review of Systems:   General:  normal appetite, normal energy, no weight gain, no weight loss, no fever  Cardiac:  no chest pain with exertion, no chest pain at rest, no SOB with exertion, no resting SOB, no  PND, no orthopnea, no palpitations, no arrhythmia, no atrial fibrillation, no LE edema, no dizzy spells, no syncope  Respiratory:  no shortness of breath, no home oxygen, no productive cough, no dry cough, no bronchitis, no wheezing, no hemoptysis, no asthma, no pain with inspiration or cough, no sleep apnea, no CPAP at night  GI:   no difficulty swallowing, no reflux, no frequent heartburn, no hiatal hernia, no abdominal pain, no constipation, no diarrhea, no hematochezia, no hematemesis, no melena  GU:  no dysuria,  + frequency, no urinary tract infection, no hematuria, no enlarged prostate, no kidney stones, no kidney disease  Vascular:  + pain suggestive of claudication left calf, no pain in feet, no leg cramps, no varicose veins, no DVT, no non-healing foot ulcer  Neuro:   no stroke, no TIA's, no seizures, no headaches, no temporary blindness one eye,  no slurred speech, no peripheral neuropathy, no chronic pain, no instability of gait, no memory/cognitive dysfunction  Musculoskeletal: no arthritis, no joint swelling, no myalgias, no difficulty walking, normal mobility   Skin:   + rash, no itching, no skin infections, no pressure sores or ulcerations  Psych:   no anxiety, no depression, no nervousness, no unusual recent stress  Eyes:   no blurry vision, no floaters, no recent vision changes,  wears glasses  ENT:   no hearing loss, no loose or painful teeth, no dentures, last saw dentist 1 year ago. Was supposed to have appt today with his dentist.  Hematologic:  no easy bruising, no abnormal bleeding, no clotting disorder, no frequent epistaxis  Endocrine:  + diabetes, does check CBG's at home     Physical Exam:   BP 107/74   Pulse 90   Resp 20   Ht 5\' 7"  (1.702 m)   Wt 135 lb (61.2 kg)   SpO2 95% Comment: RA  BMI 21.14 kg/m   General:  Thin,  well-appearing  HEENT:  Unremarkable, NCAT, PERLA, EOMI  Neck:   no JVD, no bruits, no adenopathy   Chest:   clear to auscultation,  symmetrical breath sounds, no wheezes, no rhonchi   CV:   RRR, 3/6 systolic murmur RSB, No diastolic murmur  Abdomen:  soft, non-tender, no masses   Extremities:  warm, well-perfused, pulses palpable right ankle but not left, no lower extremity edema  Rectal/GU  Deferred  Neuro:   Grossly non-focal and symmetrical throughout  Skin:   Clean and dry, no rashes, no breakdown  Diagnostic Tests:    ECHOCARDIOGRAM REPORT         Patient Name:   SAKAI WOLFORD Date of Exam: 08/15/2020  Medical Rec #:  08/17/2020         Height:       68.0 in  Accession #:    [id number]        Weight:       139.0 lb  Date of Birth:  [birth year]        BSA:          1.751 m  Patient Age:    64 years          BP:           130/69 mmHg  Patient Gender: M                 HR:           89 bpm.  Exam Location:  [facility]   Procedure: 2D Echo   Indications:    Aortic valve calcification [I35.9 (ICD-10-CM)]; Coronary  artery                  calcification seen on CT scan [I25.10 (ICD-10-CM)];  Thoracic                  aorta atherosclerosis (HCC) [I70.0 (ICD-10-CM)]     History:        Patient has no prior history of Echocardiogram  examinations.  Coronary artery calcification seen on CT scan, Thoracic  aorta                  atherosclerosis, Aortic valve calcification; Risk                  Factors:Hypertension, Diabetes and Dyslipidemia.     Sonographer:    Louie Boston  Referring Phys: [phone] BRIAN J MUNLEY   IMPRESSIONS     1. Left ventricular ejection fraction, by estimation, is 60 to 65%. The  left ventricle has normal function. The left ventricle has no regional  wall motion abnormalities. There is severe left ventricular hypertrophy.  Left ventricular diastolic parameters   are consistent with Grade I diastolic dysfunction (impaired relaxation).   2. The mitral valve is normal in structure. No evidence of mitral valve  regurgitation. No evidence of mitral stenosis.   3.  Possibility of bicuspid valve cannot be ruled out.. The aortic valve  was not well visualized. Aortic valve regurgitation is not visualized.  Severe aortic valve stenosis.   4. The inferior vena cava is normal in size with greater than 50%  respiratory variability, suggesting right atrial pressure of 3 mmHg.   FINDINGS   Left Ventricle: Left ventricular ejection fraction, by estimation, is 60  to 65%. The left ventricle has normal function. The left ventricle has no  regional wall motion abnormalities. The left ventricular internal cavity  size was normal in size. There is   severe left ventricular hypertrophy. Left ventricular diastolic  parameters are consistent with Grade I diastolic dysfunction (impaired  relaxation).   Right Ventricle: The right ventricular size is normal. No increase in  right ventricular wall thickness. Right ventricular systolic function is  normal. There is normal pulmonary artery systolic pressure. The tricuspid  regurgitant velocity is 2.70 m/s, and   with an assumed right atrial pressure of 3 mmHg, the estimated right  ventricular systolic pressure is 32.2 mmHg.   Left Atrium: Left atrial size was normal in size.   Right Atrium: Right atrial size was normal in size.   Pericardium: There is no evidence of pericardial effusion.   Mitral Valve: The mitral valve is normal in structure. No evidence of  mitral valve regurgitation. No evidence of mitral valve stenosis.   Tricuspid Valve: The tricuspid valve is normal in structure. Tricuspid  valve regurgitation is mild . No evidence of tricuspid stenosis.   Aortic Valve: Possibility of bicuspid valve cannot be ruled out. The  aortic valve was not well visualized. Aortic valve regurgitation is not  visualized. Severe aortic stenosis is present. Aortic valve mean gradient  measures 87.0 mmHg. Aortic valve peak  gradient measures 125.9 mmHg. Aortic valve area, by VTI measures 0.48 cm.   Pulmonic Valve: The  pulmonic valve was normal in structure. Pulmonic valve  regurgitation is not visualized. No evidence of pulmonic stenosis.   Aorta: The aortic root is normal in size and structure.   Venous: The inferior vena cava is normal in size with greater than 50%  respiratory variability, suggesting right atrial pressure of 3 mmHg.   IAS/Shunts: No atrial level shunt detected by color flow Doppler.      LEFT VENTRICLE  PLAX 2D  LVIDd:         3.80 cm  Diastology  LVIDs:         2.90 cm  LV e' medial:    3.48 cm/s  LV PW:         1.80  cm  LV E/e' medial:  20.4  LV IVS:        2.10 cm  LV e' lateral:   3.92 cm/s  LVOT diam:     2.10 cm  LV E/e' lateral: 18.1  LV SV:         64  LV SV Index:   36  LVOT Area:     3.46 cm      RIGHT VENTRICLE            IVC  RV S prime:     8.59 cm/s  IVC diam: 1.20 cm  TAPSE (M-mode): 1.4 cm   LEFT ATRIUM             Index       RIGHT ATRIUM          Index  LA diam:        2.90 cm 1.66 cm/m  RA Area:     9.70 cm  LA Vol (A2C):   44.7 ml 25.53 ml/m RA Volume:   20.30 ml 11.59 ml/m  LA Vol (A4C):   29.9 ml 17.08 ml/m  LA Biplane Vol: 36.5 ml 20.85 ml/m   AORTIC VALVE  AV Area (Vmax):    0.39 cm  AV Area (Vmean):   0.35 cm  AV Area (VTI):     0.48 cm  AV Vmax:           561.00 cm/s  AV Vmean:          450.000 cm/s  AV VTI:            1.340 m  AV Peak Grad:      125.9 mmHg  AV Mean Grad:      87.0 mmHg  LVOT Vmax:         63.90 cm/s  LVOT Vmean:        46.100 cm/s  LVOT VTI:          0.184 m  LVOT/AV VTI ratio: 0.14     AORTA  Ao Root diam: 3.30 cm  Ao Asc diam:  3.30 cm  Ao Desc diam: 2.10 cm   MITRAL VALVE                TRICUSPID VALVE  MV Area (PHT): 4.99 cm     TR Peak grad:   29.2 mmHg  MV Decel Time: 152 msec     TR Vmax:        270.00 cm/s  MV E velocity: 71.00 cm/s  MV A velocity: 135.00 cm/s  SHUNTS  MV E/A ratio:  0.53         Systemic VTI:  0.18 m                              Systemic Diam: 2.10 cm   Belva Crome MD   Electronically signed by Belva Crome MD  Signature Date/Time: 08/15/2020/10:46:05 AM      Physicians  Panel Physicians Referring Physician Case Authorizing Physician  Kathleene Hazel, MD (Primary)      Procedures  RIGHT HEART CATH AND CORONARY/GRAFT ANGIOGRAPHY    Conclusion    Ost LAD to Prox LAD lesion is 20% stenosed. Prox RCA lesion is 20% stenosed.   1. Mild heavily calcified disease in the proximal LAD with 20% stenosis. 2. Mild proximal RCA disease.   Recommendations: He has critical aortic stenosis by echo. I did not cross his  valve in the cath lab. He would be a candidate for TAVR or surgical AVR. Will proceed with CT scans next and then have him seen by Dr. Laneta Simmers in the CT surgery office.      Indications  Coronary artery disease involving native coronary artery of native heart without angina pectoris [I25.10 (ICD-10-CM)]  Severe aortic stenosis [I35.0 (ICD-10-CM)]    Procedural Details  Technical Details Indication: Severe aortic stenosis  Procedure: The risks, benefits, complications, treatment options, and expected outcomes were discussed with the patient. The patient and/or family concurred with the proposed plan, giving informed consent. The patient was brought to the cath lab after IV hydration was given. The patient was sedated with Versed and Fentanyl. The right groin was prepped and draped. A 7 Fr sheath was placed in the right femoral vein using u/s guidance. 1% lidocaine used for local anesthesia. Right heart catheterization performed with a balloon tipped catheter. The right wrist was prepped and draped in a sterile fashion. 1% lidocaine was used for local anesthesia. Using the modified Seldinger access technique, a 5 French sheath was placed in the right radial artery. 3 mg Verapamil was given through the sheath. 3500 units IV heparin was given. Standard diagnostic catheters were used to perform selective coronary angiography. I did not cross  the aortic valve.  The sheath was removed from the right radial artery and a Terumo hemostasis band was applied at the arteriotomy site on the right wrist.    Estimated blood loss <50 mL.   During this procedure medications were administered to achieve and maintain moderate conscious sedation while the patient's heart rate, blood pressure, and oxygen saturation were continuously monitored and I was present face-to-face 100% of this time.    Medications (Filter: Administrations occurring from 0727 to 0844 on 09/01/20)  important  Continuous medications are totaled by the amount administered until 09/01/20 0844.    fentaNYL (SUBLIMAZE) injection (mcg) Total dose:  75 mcg  Date/Time Rate/Dose/Volume Action   09/01/20 0744 50 mcg Given   0759 25 mcg Given    midazolam (VERSED) injection (mg) Total dose:  3 mg  Date/Time Rate/Dose/Volume Action   09/01/20 0744 2 mg Given   0800 1 mg Given    lidocaine (PF) (XYLOCAINE) 1 % injection (mL) Total volume:  14 mL  Date/Time Rate/Dose/Volume Action   09/01/20 0755 2 mL Given   0755 2 mL Given   0808 10 mL Given    Heparin (Porcine) in NaCl 1000-0.9 UT/500ML-% SOLN (mL) Total volume:  1,000 mL  Date/Time Rate/Dose/Volume Action   09/01/20 0759 500 mL Given   0759 500 mL Given    Radial Cocktail/Verapamil only (mL) Total volume:  10 mL  Date/Time Rate/Dose/Volume Action   09/01/20 0806 10 mL Given    heparin sodium (porcine) injection (Units) Total dose:  3,500 Units  Date/Time Rate/Dose/Volume Action   09/01/20 0819 3,500 Units Given    iohexol (OMNIPAQUE) 350 MG/ML injection (mL) Total volume:  55 mL  Date/Time Rate/Dose/Volume Action   09/01/20 0833 55 mL Given     Sedation Time  Sedation Time Physician-1: 46 minutes 45 seconds   Contrast  Medication Name Total Dose  iohexol (OMNIPAQUE) 350 MG/ML injection 55 mL    Radiation/Fluoro  Fluoro time: 9.2 (min) DAP: 9.1 (Gycm2) Cumulative Air Kerma: 166.5  (mGy)  Complications   Complications documented before study signed (09/01/2020  8:58 AM)     RIGHT HEART CATH AND CORONARY/GRAFT ANGIOGRAPHY  None Documented by Clifton James,  Nile Dear, MD 09/01/2020  8:56 AM  Date Found: 09/01/2020  Time Range: Intraprocedure        Coronary Findings   Diagnostic Dominance: Right  Left Anterior Descending  Vessel is large.  Ost LAD to Prox LAD lesion is 20% stenosed. The lesion is calcified.  Left Circumflex  Vessel is large.  Right Coronary Artery  Vessel is large.  Prox RCA lesion is 20% stenosed.   Intervention   No interventions have been documented.           Coronary Diagrams   Diagnostic Dominance: Right    Intervention     Implants     No implant documentation for this case.    Syngo Images   Show images for CARDIAC CATHETERIZATION  Images on Long Term Storage   Show images for Favian, [place] to Procedure Log  Procedure Log      Hemo Data  Flowsheet Row Most Recent Value  Fick Cardiac Output 4.38 L/min  Fick Cardiac Output Index 2.56 (L/min)/BSA  RA A Wave 3 mmHg  RA V Wave 1 mmHg  RA Mean 0 mmHg  RV Systolic Pressure 24 mmHg  RV Diastolic Pressure -3 mmHg  RV EDP 1 mmHg  PA Systolic Pressure 22 mmHg  PA Diastolic Pressure 6 mmHg  PA Mean 12 mmHg  PW A Wave 4 mmHg  PW V Wave 3 mmHg  PW Mean 2 mmHg  AO Systolic Pressure 108 mmHg  AO Diastolic Pressure 71 mmHg  AO Mean 87 mmHg  QP/QS 1  TPVR Index 4.68 HRUI  TSVR Index 33.93 HRUI  TPVR/TSVR Ratio 0.14    ADDENDUM REPORT: 09/08/2020 12:31   CLINICAL DATA:  65T with severe aortic stenosis being evaluated for a TAVR procedure.   EXAM: Cardiac TAVR CT   TECHNIQUE: The patient was scanned on a [employer]. A 120 kV retrospective scan was triggered in the descending thoracic aorta at 111 HU's. Gantry rotation speed was 250 msecs and collimation was .6 mm. No beta blockade or nitro were given. The 3D  data set was reconstructed in 5% intervals of the R-R cycle. Systolic and diastolic phases were analyzed on a dedicated work station using MPR, MIP and VRT modes. The patient received 80 cc of contrast.   FINDINGS: Aortic Root:   Aortic valve: Bicuspid aortic valve, Sievers Type 1 with fusion of left and right cusps   Aortic valve calcium score: 5682   Aortic annulus:   Diameter: 48mm x 75mm   Perimeter: 78mm   Area: 528 mm^2   Calcifications: Moderate calcification adjacent to RCC   Coronary height: Min Left - 14mm, Max Left - 58mm; Min Right - 86mm   Sinotubular height: Left cusp - 62mm; Right cusp - 83mm; Noncoronary cusp - 18mm   LVOT (as measured 3 mm below the annulus):   Diameter: 74mm x 16mm   Area: 547 mm^2   Calcifications: Mild calcification beneath RCC cusp   Aortic sinus width: Left cusp - 26mm; Right cusp - 65mm; Noncoronary cusp - 56mm   Sinotubular junction width: 30mm x 72mm   Optimum Fluoroscopic Angle for Delivery: RAO 2 CAU 26   Cardiac:   Right atrium: Normal size   Right ventricle: Normal size   Pulmonary arteries: Normal size   Pulmonary veins: Normal configuration   Left atrium: Mild enlargement   Left ventricle: Normal size, moderate hypertrophy   Pericardium: Normal thickness   Coronary arteries: Calcium score 838 (88th  percentile)   IMPRESSION: 1. Bicuspid aortic valve, Sievers Type 1 with fusion of left and right cusps. Severely calcified (AV calcium score 5682)   2. Aortic annulus measures 18mm x 44mm with perimeter 86mm and area 528 mm^2. Moderate annular calcification adjacent to RCC and extending into LVOT. Annular measurements suitable for delivery of 55mm Edwards Sapien 3 valve   3. Sufficient coronary to annulus distance, measuring 58mm to left main and 44mm to RCA   4.  Optimum Fluoroscopic Angle for Delivery:  RAO 2 CAU 26   5.  Coronary calcium score 838 (88th percentile)     Electronically Signed    By: Epifanio Lesches MD   On: 09/08/2020 12:31    Addended by Little Ishikawa, MD on 09/08/2020 12:33 PM    Study Result  Narrative & Impression  EXAM: OVER-READ INTERPRETATION  CT CHEST   The following report is an over-read performed by radiologist Dr. Trudie Reed of [facility] on 09/08/2020. This over-read does not include interpretation of cardiac or coronary anatomy or pathology. The coronary calcium score/coronary CTA interpretation by the cardiologist is attached.   COMPARISON:  None.   FINDINGS: Extracardiac findings will be described separately under dictation for contemporaneously obtained CTA chest, abdomen and pelvis.   IMPRESSION: Please see separate dictation for contemporaneously obtained CTA chest, abdomen and pelvis dated 09/08/2020 for full description of relevant extracardiac findings.   Electronically Signed: By: Trudie Reed M.D. On: 09/08/2020 10:07    Narrative & Impression  CLINICAL DATA:  65 year old male with history of severe aortic stenosis. Preprocedural study prior to potential transcatheter aortic valve replacement (TAVR) procedure.   EXAM: CT ANGIOGRAPHY CHEST, ABDOMEN AND PELVIS   TECHNIQUE: Multidetector CT imaging through the chest, abdomen and pelvis was performed using the standard protocol during bolus administration of intravenous contrast. Multiplanar reconstructed images and MIPs were obtained and reviewed to evaluate the vascular anatomy.   CONTRAST:  95 mL of Omnipaque 350.   COMPARISON:  Cardiac CT 08/12/2020.   FINDINGS: CTA CHEST FINDINGS   Cardiovascular: Heart size is normal. There is no significant pericardial fluid, thickening or pericardial calcification. There is aortic atherosclerosis, as well as atherosclerosis of the great vessels of the mediastinum and the coronary arteries, including calcified atherosclerotic plaque in the left main, left anterior descending, left  circumflex and right coronary arteries. Severe thickening calcification of the aortic valve.   Mediastinum/Lymph Nodes: No pathologically enlarged mediastinal or hilar lymph nodes. Several densely calcified right hilar lymph nodes are incidentally noted. Esophagus is unremarkable in appearance. No axillary lymphadenopathy.   Lungs/Pleura: No suspicious appearing pulmonary nodules or masses are noted. No acute consolidative airspace disease. No pleural effusions.   Musculoskeletal/Soft Tissues: There are no aggressive appearing lytic or blastic lesions noted in the visualized portions of the skeleton.   CTA ABDOMEN AND PELVIS FINDINGS   Hepatobiliary: 1.2 x 1.0 cm low-attenuation lesion in segment 7 of the liver, compatible with a simple cyst. No other suspicious hepatic lesions. No intra or extrahepatic biliary ductal dilatation. Gallbladder is normal in appearance.   Pancreas: No pancreatic mass. No pancreatic ductal dilatation. No pancreatic or peripancreatic fluid collections or inflammatory changes.   Spleen: Unremarkable.   Adrenals/Urinary Tract: Bilateral kidneys and adrenal glands are normal in appearance. No hydroureteronephrosis. Urinary bladder is unremarkable in appearance.   Stomach/Bowel: The appearance of the stomach is normal. There is no pathologic dilatation of small bowel or colon. Normal appendix.   Vascular/Lymphatic: Vascular findings and  measurements pertinent to potential TAVR procedure, as detailed below. Extensive atherosclerosis of the abdominal aorta and pelvic vasculature. No lymphadenopathy noted in the abdomen or pelvis.   Reproductive: Prostate gland and seminal vesicles are unremarkable in appearance.   Other: No significant volume of ascites.  No pneumoperitoneum.   Musculoskeletal: There are no aggressive appearing lytic or blastic lesions noted in the visualized portions of the skeleton.   VASCULAR MEASUREMENTS PERTINENT TO TAVR:    AORTA:   Minimal Aortic Diameter-9 x 8 mm mm   Severity of Aortic Calcification-severe   RIGHT PELVIS:   Right Common Iliac Artery -   Minimal Diameter-6.4 x 4.7 mm   Tortuosity-mild   Calcification-moderate   Right External Iliac Artery -   Minimal Diameter-4.9 x 3.8 mm   Tortuosity-moderate   Calcification-moderate   Right Common Femoral Artery -   Minimal Diameter-7.2 x 5.1 mm   Tortuosity-mild   Calcification-moderate   LEFT PELVIS:   Left Common Iliac Artery -   Minimal Diameter-3.4 x 1.9 mm   Tortuosity-mild   Calcification-moderate   Comment- ectatic vessel measuring up to 1.4 cm in diameter with large amount of atheromatous plaque and/or mural thrombus causing near complete occlusion.   Left External Iliac Artery -   Minimal Diameter-5.0 x 4.0 mm   Tortuosity-mild   Calcification-moderate   Left Common Femoral Artery -   Minimal Diameter-4.9 x 4.3 mm   Tortuosity-mild   Calcification-moderate   Review of the MIP images confirms the above findings.   IMPRESSION: 1. Vascular findings and measurements pertinent to potential TAVR procedure, as detailed above. 2. Severe thickening calcification of the aortic valve, compatible with reported clinical history of severe aortic stenosis. 3. Aortic atherosclerosis, in addition to left main and 3 vessel coronary artery disease. 4. Additional incidental findings, as above.     Electronically Signed   By: Trudie Reed M.D.   On: 09/08/2020 10:40      Impression:  This 65 year old gentleman has stage C, asymptomatic critical aortic stenosis although his family does note some exertional intolerance.  His 2D echocardiogram shows a severely calcified aortic valve with a mean gradient of 87 mmHg and a peak gradient of 126 mmHg with a valve area of 0.48 cm consistent with critical aortic stenosis.  Left ventricular systolic function is preserved with severe concentric LVH.  He has mild  insignificant coronary disease on catheterization.  His gated cardiac CTA shows a severely calcified bicuspid aortic valve with fusion of left and right cusp and a calcium score of 5682.  There is moderate annular calcium adjacent to the right coronary cusp and extending into the LVOT.  I think the best option for treating this patient would be open surgical aortic valve replacement using a bioprosthetic valve.  I discussed transcatheter aortic valve replacement with him and his wife including the relative contraindication with severe bulky calcification of a bicuspid aortic valve as well as annular calcium extending into the LVOT.  I think his risk for paravalvular leak and coronary obstruction due to the bulky calcification would be increased.  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  I discussed the option of continued observation although I do not recommend it due to the risk of progressive left ventricular deterioration and sudden death.  I reviewed the echo and cath films as well as the CT images with them and answered their questions. I  discussed the operative procedure with the patient and family including alternatives, benefits and risks; including but not limited to bleeding, blood transfusion, infection, stroke, myocardial infarction, graft failure, heart block requiring a permanent pacemaker, organ dysfunction, and death.  Robert Li understands and agrees to proceed.    Plan:  He will call us to schedule open surgical aortic valve replacement using a bioprosthetic valve after he has time discussed it with his son.  He will be referred to general vascular surgery after his valve has been replaced for further evaluation of his left common iliac artery occlusion.  I spent 60 minutes performing this consultation and > 50% of this time was spent face to face counseling and  coordinating the care of this patient's critical bicuspid aortic valve stenosis.    [name], MD 09/09/2020 2:15 PM

## 2020-09-10 ENCOUNTER — Encounter: Payer: Self-pay | Admitting: Surgery

## 2020-09-15 ENCOUNTER — Encounter: Payer: Self-pay | Admitting: *Deleted

## 2020-09-15 ENCOUNTER — Other Ambulatory Visit: Payer: Self-pay | Admitting: *Deleted

## 2020-09-15 DIAGNOSIS — I219 Acute myocardial infarction, unspecified: Secondary | ICD-10-CM

## 2020-09-15 DIAGNOSIS — I350 Nonrheumatic aortic (valve) stenosis: Secondary | ICD-10-CM

## 2020-09-15 DIAGNOSIS — Z01818 Encounter for other preprocedural examination: Secondary | ICD-10-CM

## 2020-09-15 DIAGNOSIS — I21A9 Other myocardial infarction type: Secondary | ICD-10-CM

## 2020-10-07 NOTE — Progress Notes (Signed)
Surgical Instructions    Your procedure is scheduled on Friday, August 19th, 2022.   Report to [facility] Main Entrance "A" at 05:30 A.M., then check in with the Admitting office.  Call this number if you have problems the morning of surgery:  [phone]   If you have any questions prior to your surgery date call [phone]: Open Monday-Friday 8am-4pm    Remember:  Do not eat or drink after midnight the night before your surgery    Take these medicines the morning of surgery with A SIP OF WATER:  amLODipine (NORVASC)  Follow your surgeon's instructions on when to stop Aspirin.  If no instructions were given by your surgeon then you will need to call the office to get those instructions.     As of today, STOP taking any Aspirin (unless otherwise instructed by your surgeon) Aleve, Naproxen, Ibuprofen, Motrin, Advil, Goody's, BC's, all herbal medications, fish oil, and all vitamins.  WHAT DO I DO ABOUT MY DIABETES MEDICATION?   Do not take metFORMIN (GLUCOPHAGE) the morning of surgery.  Do not take JARDIANCE the day before surgery and the day of surgery      HOW TO MANAGE YOUR DIABETES BEFORE AND AFTER SURGERY  Why is it important to control my blood sugar before and after surgery? Improving blood sugar levels before and after surgery helps healing and can limit problems. A way of improving blood sugar control is eating a healthy diet by:  Eating less sugar and carbohydrates  Increasing activity/exercise  Talking with your doctor about reaching your blood sugar goals High blood sugars (greater than 180 mg/dL) can raise your risk of infections and slow your recovery, so you will need to focus on controlling your diabetes during the weeks before surgery. Make sure that the doctor who takes care of your diabetes knows about your planned surgery including the date and location.  How do I manage my blood sugar before surgery? Check your blood sugar at least 4 times a day,  starting 2 days before surgery, to make sure that the level is not too high or low.  Check your blood sugar the morning of your surgery when you wake up and every 2 hours until you get to the Short Stay unit.  If your blood sugar is less than 70 mg/dL, you will need to treat for low blood sugar: Do not take insulin. Treat a low blood sugar (less than 70 mg/dL) with  cup of clear juice (cranberry or apple), 4 glucose tablets, OR glucose gel. Recheck blood sugar in 15 minutes after treatment (to make sure it is greater than 70 mg/dL). If your blood sugar is not greater than 70 mg/dL on recheck, call [phone] for further instructions. Report your blood sugar to the short stay nurse when you get to Short Stay.  If you are admitted to the hospital after surgery: Your blood sugar will be checked by the staff and you will probably be given insulin after surgery (instead of oral diabetes medicines) to make sure you have good blood sugar levels. The goal for blood sugar control after surgery is 80-180 mg/dL.           Do not wear jewelry  Do not wear lotions, powders, colognes, or deodorant. Do not shave 48 hours prior to surgery.  Men may shave face and neck. Do not bring valuables to the hospital.             [facility] is not responsible for  any belongings or valuables.  Do NOT Smoke (Tobacco/Vaping) or drink Alcohol 24 hours prior to your procedure If you use a CPAP at night, you may bring all equipment for your overnight stay.   Contacts, glasses, dentures or bridgework may not be worn into surgery, please bring cases for these belongings   For patients admitted to the hospital, discharge time will be determined by your treatment team.   Patients discharged the day of surgery will not be allowed to drive home, and someone needs to stay with them for 24 hours.  ONLY 1 SUPPORT PERSON MAY BE PRESENT WHILE YOU ARE IN SURGERY. IF YOU ARE TO BE ADMITTED ONCE YOU ARE IN YOUR ROOM YOU WILL BE  ALLOWED TWO (2) VISITORS.  Minor children may have two parents present. Special consideration for safety and communication needs will be reviewed on a case by case basis.  Special instructions:    Oral Hygiene is also important to reduce your risk of infection.  Remember - BRUSH YOUR TEETH THE MORNING OF SURGERY WITH YOUR REGULAR TOOTHPASTE   [place]- Preparing For Surgery  Before surgery, you can play an important role. Because skin is not sterile, your skin needs to be as free of germs as possible. You can reduce the number of germs on your skin by washing with CHG (chlorahexidine gluconate) Soap before surgery.  CHG is an antiseptic cleaner which kills germs and bonds with the skin to continue killing germs even after washing.     Please do not use if you have an allergy to CHG or antibacterial soaps. If your skin becomes reddened/irritated stop using the CHG.  Do not shave (including legs and underarms) for at least 48 hours prior to first CHG shower. It is OK to shave your face.  Please follow these instructions carefully.     Shower the NIGHT BEFORE SURGERY and the MORNING OF SURGERY with CHG Soap.   If you chose to wash your hair, wash your hair first as usual with your normal shampoo. After you shampoo, rinse your hair and body thoroughly to remove the shampoo.  Then [employer] and genitals (private parts) with your normal soap and rinse thoroughly to remove soap.  After that Use CHG Soap as you would any other liquid soap. You can apply CHG directly to the skin and wash gently with a scrungie or a clean washcloth.   Apply the CHG Soap to your body ONLY FROM THE NECK DOWN.  Do not use on open wounds or open sores. Avoid contact with your eyes, ears, mouth and genitals (private parts). Wash Face and genitals (private parts)  with your normal soap.   Wash thoroughly, paying special attention to the area where your surgery will be performed.  Thoroughly rinse your body with warm  water from the neck down.  DO NOT shower/wash with your normal soap after using and rinsing off the CHG Soap.  Pat yourself dry with a CLEAN TOWEL.  Wear CLEAN PAJAMAS to bed the night before surgery  Place CLEAN SHEETS on your bed the night before your surgery  DO NOT SLEEP WITH PETS.   Day of Surgery:  Take a shower with CHG soap. Wear Clean/Comfortable clothing the morning of surgery Do not apply any deodorants/lotions.   Remember to brush your teeth WITH YOUR REGULAR TOOTHPASTE.   Please read over the following fact sheets that you were given.

## 2020-10-08 ENCOUNTER — Other Ambulatory Visit: Payer: Self-pay

## 2020-10-08 ENCOUNTER — Encounter (HOSPITAL_COMMUNITY)
Admission: RE | Admit: 2020-10-08 | Discharge: 2020-10-08 | Disposition: A | Discharge status: Home / Self Care | Payer: Medicare Other | Source: Ambulatory Visit | Attending: Surgery | Admitting: Surgery

## 2020-10-08 ENCOUNTER — Encounter (HOSPITAL_COMMUNITY): Payer: Self-pay

## 2020-10-08 ENCOUNTER — Ambulatory Visit (HOSPITAL_BASED_OUTPATIENT_CLINIC_OR_DEPARTMENT_OTHER)
Admission: RE | Admit: 2020-10-08 | Discharge: 2020-10-08 | Disposition: A | Discharge status: Home / Self Care | Payer: Medicare Other | Source: Ambulatory Visit | Attending: Surgery | Admitting: Surgery

## 2020-10-08 DIAGNOSIS — I10 Essential (primary) hypertension: Secondary | ICD-10-CM | POA: Insufficient documentation

## 2020-10-08 DIAGNOSIS — Z20822 Contact with and (suspected) exposure to covid-19: Secondary | ICD-10-CM | POA: Insufficient documentation

## 2020-10-08 DIAGNOSIS — I350 Nonrheumatic aortic (valve) stenosis: Secondary | ICD-10-CM

## 2020-10-08 DIAGNOSIS — I21A9 Other myocardial infarction type: Secondary | ICD-10-CM

## 2020-10-08 DIAGNOSIS — E119 Type 2 diabetes mellitus without complications: Secondary | ICD-10-CM | POA: Insufficient documentation

## 2020-10-08 DIAGNOSIS — Z01818 Encounter for other preprocedural examination: Secondary | ICD-10-CM

## 2020-10-08 DIAGNOSIS — F17200 Nicotine dependence, unspecified, uncomplicated: Secondary | ICD-10-CM | POA: Insufficient documentation

## 2020-10-08 DIAGNOSIS — I219 Acute myocardial infarction, unspecified: Secondary | ICD-10-CM

## 2020-10-08 DIAGNOSIS — I2510 Atherosclerotic heart disease of native coronary artery without angina pectoris: Secondary | ICD-10-CM | POA: Insufficient documentation

## 2020-10-08 LAB — URINALYSIS, ROUTINE W REFLEX MICROSCOPIC
Bacteria, UA: NONE SEEN
Bilirubin Urine: NEGATIVE
Glucose, UA: >=500 mg/dL — AB
Ketones, ur: 20 mg/dL — AB
Leukocytes,Ua: NEGATIVE
Nitrite: NEGATIVE
Protein, ur: NEGATIVE mg/dL
Specific Gravity, Urine: 1.024 (ref 1.005–1.030)
pH: 5 (ref 5.0–8.0)

## 2020-10-08 LAB — SURGICAL PCR SCREEN
MRSA, PCR: NEGATIVE
Staphylococcus aureus: NEGATIVE

## 2020-10-08 LAB — BLOOD GAS, ARTERIAL
Acid-base deficit: 6.4 mmol/L — ABNORMAL HIGH (ref 0.0–2.0)
Bicarbonate: 18 mmol/L — ABNORMAL LOW (ref 20.0–28.0)
Drawn by: 602861
FIO2: 21
O2 Saturation: 97.2 %
Patient temperature: 37
pCO2 arterial: 32 mmHg (ref 32.0–48.0)
pH, Arterial: 7.368 (ref 7.350–7.450)
pO2, Arterial: 95.9 mmHg (ref 83.0–108.0)

## 2020-10-08 LAB — SARS CORONAVIRUS 2 (TAT 6-24 HRS): SARS Coronavirus 2: NEGATIVE

## 2020-10-08 LAB — GLUCOSE, CAPILLARY: Glucose-Capillary: 134 mg/dL — ABNORMAL HIGH (ref 70–99)

## 2020-10-08 NOTE — Progress Notes (Signed)
Labs clotted x2 from two different areas during PAT appt. Reordered all labs so they can be obtained DOS. Dr. Garen Grams office notified as well.

## 2020-10-08 NOTE — Progress Notes (Signed)
Surgical Instructions    Your procedure is scheduled on Friday, August 19th, 2022.   Report to [facility] Main Entrance "A" at 05:30 A.M., then check in with the Admitting office.  Call this number if you have problems the morning of surgery:  [phone]   If you have any questions prior to your surgery date call [phone]: Open Monday-Friday 8am-4pm    Remember:  Do not eat or drink after midnight the night before your surgery    Take these medicines the morning of surgery with A SIP OF WATER:  amLODipine (NORVASC)    As of today, STOP taking any Aspirin (unless otherwise instructed by your surgeon) Aleve, Naproxen, Ibuprofen, Motrin, Advil, Goody's, BC's, all herbal medications, fish oil, and all vitamins. Your surgeon should have called you and instructed you to stop taking aspirin 7 days prior to surgery.   WHAT DO I DO ABOUT MY DIABETES MEDICATION?   Do not take metFORMIN (GLUCOPHAGE) the morning of surgery.  Do not take JARDIANCE the day before surgery and the day of surgery      HOW TO MANAGE YOUR DIABETES BEFORE AND AFTER SURGERY  Why is it important to control my blood sugar before and after surgery? Improving blood sugar levels before and after surgery helps healing and can limit problems. A way of improving blood sugar control is eating a healthy diet by:  Eating less sugar and carbohydrates  Increasing activity/exercise  Talking with your doctor about reaching your blood sugar goals High blood sugars (greater than 180 mg/dL) can raise your risk of infections and slow your recovery, so you will need to focus on controlling your diabetes during the weeks before surgery. Make sure that the doctor who takes care of your diabetes knows about your planned surgery including the date and location.  How do I manage my blood sugar before surgery? Check your blood sugar at least 4 times a day, starting 2 days before surgery, to make sure that the level is not too  high or low.  Check your blood sugar the morning of your surgery when you wake up and every 2 hours until you get to the Short Stay unit.  If your blood sugar is less than 70 mg/dL, you will need to treat for low blood sugar: Do not take insulin. Treat a low blood sugar (less than 70 mg/dL) with  cup of clear juice (cranberry or apple), 4 glucose tablets, OR glucose gel. Recheck blood sugar in 15 minutes after treatment (to make sure it is greater than 70 mg/dL). If your blood sugar is not greater than 70 mg/dL on recheck, call [phone] for further instructions. Report your blood sugar to the short stay nurse when you get to Short Stay.  If you are admitted to the hospital after surgery: Your blood sugar will be checked by the staff and you will probably be given insulin after surgery (instead of oral diabetes medicines) to make sure you have good blood sugar levels. The goal for blood sugar control after surgery is 80-180 mg/dL.           Do not wear jewelry  Do not wear lotions, powders, colognes, or deodorant. Do not shave 48 hours prior to surgery.  Men may shave face and neck. Do not bring valuables to the hospital.             [facility] is not responsible for any belongings or valuables.  Do NOT Smoke (Tobacco/Vaping) or drink Alcohol 24  hours prior to your procedure If you use a CPAP at night, you may bring all equipment for your overnight stay.   Contacts, glasses, dentures or bridgework may not be worn into surgery, please bring cases for these belongings   For patients admitted to the hospital, discharge time will be determined by your treatment team.   Patients discharged the day of surgery will not be allowed to drive home, and someone needs to stay with them for 24 hours.  ONLY 1 SUPPORT PERSON MAY BE PRESENT WHILE YOU ARE IN SURGERY. IF YOU ARE TO BE ADMITTED ONCE YOU ARE IN YOUR ROOM YOU WILL BE ALLOWED TWO (2) VISITORS.  Minor children may have two parents  present. Special consideration for safety and communication needs will be reviewed on a case by case basis.  Special instructions:    Oral Hygiene is also important to reduce your risk of infection.  Remember - BRUSH YOUR TEETH THE MORNING OF SURGERY WITH YOUR REGULAR TOOTHPASTE   [place]- Preparing For Surgery  Before surgery, you can play an important role. Because skin is not sterile, your skin needs to be as free of germs as possible. You can reduce the number of germs on your skin by washing with CHG (chlorahexidine gluconate) Soap before surgery.  CHG is an antiseptic cleaner which kills germs and bonds with the skin to continue killing germs even after washing.     Please do not use if you have an allergy to CHG or antibacterial soaps. If your skin becomes reddened/irritated stop using the CHG.  Do not shave (including legs and underarms) for at least 48 hours prior to first CHG shower. It is OK to shave your face.  Please follow these instructions carefully.     Shower the NIGHT BEFORE SURGERY and the MORNING OF SURGERY with CHG Soap.   If you chose to wash your hair, wash your hair first as usual with your normal shampoo. After you shampoo, rinse your hair and body thoroughly to remove the shampoo.  Then [employer] and genitals (private parts) with your normal soap and rinse thoroughly to remove soap.  After that Use CHG Soap as you would any other liquid soap. You can apply CHG directly to the skin and wash gently with a scrungie or a clean washcloth.   Apply the CHG Soap to your body ONLY FROM THE NECK DOWN.  Do not use on open wounds or open sores. Avoid contact with your eyes, ears, mouth and genitals (private parts). Wash Face and genitals (private parts)  with your normal soap.   Wash thoroughly, paying special attention to the area where your surgery will be performed.  Thoroughly rinse your body with warm water from the neck down.  DO NOT shower/wash with your normal  soap after using and rinsing off the CHG Soap.  Pat yourself dry with a CLEAN TOWEL.  Wear CLEAN PAJAMAS to bed the night before surgery  Place CLEAN SHEETS on your bed the night before your surgery  DO NOT SLEEP WITH PETS.   Day of Surgery:  Take a shower with CHG soap. Wear Clean/Comfortable clothing the morning of surgery Do not apply any deodorants/lotions.   Remember to brush your teeth WITH YOUR REGULAR TOOTHPASTE.   Please read over the following fact sheets that you were given.

## 2020-10-08 NOTE — Progress Notes (Signed)
PCP - Gwendlyn Deutscher Cardiologist - Dr. Dulce Sellar in [place]  PPM/ICD - denies   Chest x-ray - 10/08/20 EKG - 10/08/20 Stress Test - denies ECHO - 08/15/20 Cardiac Cath - 09/01/20  Sleep Study - denies   Fasting Blood Sugar - unknown Checks Blood Sugar : no  As of today, STOP taking any Aspirin (unless otherwise instructed by your surgeon) Aleve, Naproxen, Ibuprofen, Motrin, Advil, Goody's, BC's, all herbal medications, fish oil, and all vitamins. Your surgeon should have called you and instructed you to stop taking aspirin 7 days prior to surgery.   ERAS Protcol -no   COVID TEST- done in PAT 10/08/20   Anesthesia review: yes, cardiac history  Patient denies shortness of breath, fever, cough and chest pain at PAT appointment   All instructions explained to the patient, with a verbal understanding of the material. Patient agrees to go over the instructions while at home for a better understanding. Patient also instructed to self quarantine after being tested for COVID-19. The opportunity to ask questions was provided.

## 2020-10-09 MED ORDER — POTASSIUM CHLORIDE 2 MEQ/ML IV SOLN
80.0000 meq | INTRAVENOUS | Status: DC
  Filled 2020-10-09: qty 40

## 2020-10-09 MED ORDER — NITROGLYCERIN IN D5W 200-5 MCG/ML-% IV SOLN
2.0000 ug/min | INTRAVENOUS | Status: DC
  Filled 2020-10-09: qty 250

## 2020-10-09 MED ORDER — MILRINONE LACTATE IN DEXTROSE 20-5 MG/100ML-% IV SOLN
0.3000 ug/kg/min | INTRAVENOUS | Status: DC
  Filled 2020-10-09: qty 100

## 2020-10-09 MED ORDER — VANCOMYCIN HCL 1250 MG/250ML IV SOLN
1250.0000 mg | INTRAVENOUS | Status: AC
  Administered 2020-10-10: 1250 mg via INTRAVENOUS
  Filled 2020-10-09: qty 250

## 2020-10-09 MED ORDER — PHENYLEPHRINE HCL-NACL 20-0.9 MG/250ML-% IV SOLN
30.0000 ug/min | INTRAVENOUS | Status: AC
  Administered 2020-10-10: 20 ug/min via INTRAVENOUS
  Filled 2020-10-09: qty 250

## 2020-10-09 MED ORDER — CEFAZOLIN SODIUM-DEXTROSE 2-4 GM/100ML-% IV SOLN
2.0000 g | INTRAVENOUS | Status: AC
  Administered 2020-10-10: 2 g via INTRAVENOUS
  Filled 2020-10-09: qty 100

## 2020-10-09 MED ORDER — DEXMEDETOMIDINE HCL IN NACL 400 MCG/100ML IV SOLN
0.1000 ug/kg/h | INTRAVENOUS | Status: AC
  Administered 2020-10-10: .7 ug/kg/h via INTRAVENOUS
  Filled 2020-10-09: qty 100

## 2020-10-09 MED ORDER — TRANEXAMIC ACID 1000 MG/10ML IV SOLN
1.5000 mg/kg/h | INTRAVENOUS | Status: AC
  Administered 2020-10-10: 1.5 mg/kg/h via INTRAVENOUS
  Filled 2020-10-09: qty 25

## 2020-10-09 MED ORDER — PLASMA-LYTE A IV SOLN
INTRAVENOUS | Status: DC
  Filled 2020-10-09: qty 5

## 2020-10-09 MED ORDER — SODIUM CHLORIDE 0.9 % IV SOLN
INTRAVENOUS | Status: DC
  Filled 2020-10-09: qty 30

## 2020-10-09 MED ORDER — INSULIN REGULAR(HUMAN) IN NACL 100-0.9 UT/100ML-% IV SOLN
INTRAVENOUS | Status: AC
  Administered 2020-10-10: 1.1 [IU]/h via INTRAVENOUS
  Filled 2020-10-09: qty 100

## 2020-10-09 MED ORDER — EPINEPHRINE HCL 5 MG/250ML IV SOLN IN NS
0.0000 ug/min | INTRAVENOUS | Status: DC
  Filled 2020-10-09: qty 250

## 2020-10-09 MED ORDER — TRANEXAMIC ACID (OHS) PUMP PRIME SOLUTION
2.0000 mg/kg | INTRAVENOUS | Status: DC
  Filled 2020-10-09: qty 1.27

## 2020-10-09 MED ORDER — CEFAZOLIN SODIUM-DEXTROSE 2-4 GM/100ML-% IV SOLN
2.0000 g | INTRAVENOUS | Status: AC
  Administered 2020-10-10: 2 g via INTRAVENOUS
  Filled 2020-10-09 (×2): qty 100

## 2020-10-09 MED ORDER — TRANEXAMIC ACID (OHS) BOLUS VIA INFUSION
15.0000 mg/kg | INTRAVENOUS | Status: AC
  Administered 2020-10-10: 954 mg via INTRAVENOUS
  Filled 2020-10-09: qty 954

## 2020-10-09 MED ORDER — NOREPINEPHRINE 4 MG/250ML-% IV SOLN
0.0000 ug/min | INTRAVENOUS | Status: DC
  Filled 2020-10-09: qty 250

## 2020-10-09 MED ORDER — MANNITOL 20 % IV SOLN
Freq: Once | INTRAVENOUS | Status: DC
  Filled 2020-10-09: qty 13

## 2020-10-09 NOTE — Anesthesia Preprocedure Evaluation (Addendum)
Anesthesia Evaluation  Patient identified by MRN, date of birth, ID band Patient awake    Reviewed: Allergy & Precautions, H&P , NPO status , Patient's Chart, lab work & pertinent test results, reviewed documented beta blocker date and time   Airway Mallampati: III  TM Distance: >3 FB Neck ROM: Full    Dental no notable dental hx. (+) Teeth Intact, Dental Advisory Given   Pulmonary neg pulmonary ROS, Current Smoker and Patient abstained from smoking.,    Pulmonary exam normal breath sounds clear to auscultation       Cardiovascular Exercise Tolerance: Good hypertension, Pt. on medications and Pt. on home beta blockers  Rhythm:Regular Rate:Normal     Neuro/Psych negative neurological ROS  negative psych ROS   GI/Hepatic negative GI ROS, Neg liver ROS,   Endo/Other  diabetes, Type 2, Oral Hypoglycemic Agents  Renal/GU negative Renal ROS  negative genitourinary   Musculoskeletal   Abdominal   Peds  Hematology negative hematology ROS (+)   Anesthesia Other Findings   Reproductive/Obstetrics negative OB ROS                            Anesthesia Physical Anesthesia Plan  ASA: 4  Anesthesia Plan: General   Post-op Pain Management:    Induction: Intravenous  PONV Risk Score and Plan: 1 and Midazolam and Treatment may vary due to age or medical condition  Airway Management Planned: Oral ETT  Additional Equipment: Arterial line, CVP, PA Cath, TEE and Ultrasound Guidance Line Placement  Intra-op Plan:   Post-operative Plan: Post-operative intubation/ventilation  Informed Consent: I have reviewed the patients History and Physical, chart, labs and discussed the procedure including the risks, benefits and alternatives for the proposed anesthesia with the patient or authorized representative who has indicated his/her understanding and acceptance.     Dental advisory given  Plan Discussed  with: CRNA  Anesthesia Plan Comments:        Anesthesia Quick Evaluation

## 2020-10-09 NOTE — H&P (Signed)
[address]       [place] [address]             [phone]      Cardiothoracic Surgery Admission History and Physical   PCP is Jeanie Sewer, Valrie Hart, MD Referring Provider is Verne Carrow, MD Primary Cardiologist is Norman Herrlich, MD   Reason for admission:  Critical aortic stenosis   HPI:   The patient is a 65 year old gentleman with history of hypertension, diabetes, hypercholesterolemia, heavy smoking, alcohol abuse, and bicuspid aortic valve stenosis recently diagnosed on a lung cancer screening CT which showed heavy aortic valve calcification.  2D echocardiogram on 08/15/2020 showed a heavily calcified aortic valve with a mean gradient of 87 mmHg and a peak gradient of 126 mmHg.  Aortic valve area by VTI was 0.48 cm.  Left ventricular ejection fraction was 60 to 65% with severe LVH.  Cardiac catheterization on 09/01/2020 showed mild nonobstructive coronary disease in the proximal RCA and LAD.  Right heart pressures were normal.   The patient is here today with his wife.  He denies any symptoms of exertional shortness of breath or chest discomfort.  He denies fatigue.  He has had no dizziness or syncope.  He does report when he push mows his grass that he develops left calf tightness after a couple trips around his yard.  This resolves quickly with rest.  His wife reports that he frequently takes naps later in the afternoon.  His son is a Engineer, civil (consulting) and works in a Armed forces operational officer.  He is not here today but according the chart he has noticed some exercise intolerance.       Past Medical History:  Diagnosis Date   Benign essential hypertension     Diabetes mellitus with renal complications (HCC)     History of alcohol abuse     Hypercholesterolemia     Onychomycosis     Severe aortic stenosis     Tobacco abuse             Past Surgical History:  Procedure Laterality Date   NO PAST SURGERIES       RIGHT HEART CATH AND CORONARY/GRAFT  ANGIOGRAPHY N/A 09/01/2020    Procedure: RIGHT HEART CATH AND CORONARY/GRAFT ANGIOGRAPHY;  Surgeon: Kathleene Hazel, MD;  Location: [facility];  Service: Cardiovascular;  Laterality: N/A;           Family History  Problem Relation Age of Onset   Alzheimer's disease Mother     Colon cancer Father     Diabetes Father     Hypertension Father        Social History         Socioeconomic History   Marital status: Unknown      Spouse name: Not on file   Number of children: Not on file   Years of education: Not on file   Highest education level: Not on file  Occupational History   Not on file  Tobacco Use   Smoking status: Every Day      Packs/day: 1.00      Types: Cigarettes   Smokeless tobacco: Never  Substance and Sexual Activity   Alcohol use: Yes      Alcohol/week: 35.0 standard drinks      Types: 35 Cans of beer per week   Drug use: Never   Sexual activity: Not on file  Other Topics Concern   Not on file  Social History Narrative   Not on file    Social Determinants of Health    Financial Resource Strain: Not on file  Food Insecurity: Not on file  Transportation Needs: Not on file  Physical Activity: Not on file  Stress: Not on file  Social Connections: Not on file  Intimate Partner Violence: Not on file             Prior to Admission medications   Medication Sig Start Date End Date Taking? Authorizing Provider  amLODipine (NORVASC) 10 MG tablet Take 5 mg by mouth 2 (two) times daily. 06/12/20   Yes [provider]  aspirin EC 81 MG tablet Take 81 mg by mouth daily. Swallow whole.     Yes [provider]  CINNAMON PO Take 1 capsule by mouth daily.     Yes [provider]  JARDIANCE 10 MG TABS tablet Take 10 mg by mouth daily. 07/17/20   Yes [provider]  metFORMIN (GLUCOPHAGE) 500 MG tablet Take 500 mg by mouth 2 (two) times daily. 07/02/20   Yes [provider]  metoprolol tartrate (LOPRESSOR) 100 MG  tablet Take as directed on 7/18 prior to CT scans 09/02/20   Yes Kathleene Hazel, MD  Multiple Vitamin (MULTIVITAMIN WITH MINERALS) TABS tablet Take 1 tablet by mouth every other day.     Yes [provider]  rosuvastatin (CRESTOR) 5 MG tablet Take 5 mg by mouth at bedtime. 06/22/20   Yes [provider]  telmisartan (MICARDIS) 40 MG tablet Take 40 mg by mouth daily. 06/12/20   Yes [provider]  triamcinolone cream (KENALOG) 0.1 % Apply 1 application topically 2 (two) times daily as needed (itching/rash).     Yes [provider]            Current Outpatient Medications  Medication Sig Dispense Refill   amLODipine (NORVASC) 10 MG tablet Take 5 mg by mouth 2 (two) times daily.       aspirin EC 81 MG tablet Take 81 mg by mouth daily. Swallow whole.       CINNAMON PO Take 1 capsule by mouth daily.       JARDIANCE 10 MG TABS tablet Take 10 mg by mouth daily.       metFORMIN (GLUCOPHAGE) 500 MG tablet Take 500 mg by mouth 2 (two) times daily.       metoprolol tartrate (LOPRESSOR) 100 MG tablet Take as directed on 7/18 prior to CT scans 1 tablet 0   Multiple Vitamin (MULTIVITAMIN WITH MINERALS) TABS tablet Take 1 tablet by mouth every other day.       rosuvastatin (CRESTOR) 5 MG tablet Take 5 mg by mouth at bedtime.       telmisartan (MICARDIS) 40 MG tablet Take 40 mg by mouth daily.       triamcinolone cream (KENALOG) 0.1 % Apply 1 application topically 2 (two) times daily as needed (itching/rash).        No current facility-administered medications for this visit.           Allergies  Allergen Reactions   Altace [Ramipril] Cough   Hctz [Hydrochlorothiazide]        Unknown reaction   Wellbutrin [Bupropion] Rash          Review of Systems:               General:  normal appetite, normal energy, no weight gain, no weight loss, no fever             Cardiac:                       no chest pain with exertion, no chest pain at  rest, no SOB with exertion, no resting SOB, no PND, no orthopnea, no palpitations, no arrhythmia, no atrial fibrillation, no LE edema, no dizzy spells, no syncope             Respiratory:                 no shortness of breath, no home oxygen, no productive cough, no dry cough, no bronchitis, no wheezing, no hemoptysis, no asthma, no pain with inspiration or cough, no sleep apnea, no CPAP at night             GI:                               no difficulty swallowing, no reflux, no frequent heartburn, no hiatal hernia, no abdominal pain, no constipation, no diarrhea, no hematochezia, no hematemesis, no melena             GU:                              no dysuria,  + frequency, no urinary tract infection, no hematuria, no enlarged prostate, no kidney stones, no kidney disease             Vascular:                     + pain suggestive of claudication left calf, no pain in feet, no leg cramps, no varicose veins, no DVT, no non-healing foot ulcer             Neuro:                         no stroke, no TIA's, no seizures, no headaches, no temporary blindness one eye,  no slurred speech, no peripheral neuropathy, no chronic pain, no instability of gait, no memory/cognitive dysfunction             Musculoskeletal:         no arthritis, no joint swelling, no myalgias, no difficulty walking, normal mobility              Skin:                            + rash, no itching, no skin infections, no pressure sores or ulcerations             Psych:                         no anxiety, no depression, no nervousness, no unusual recent stress             Eyes:                           no blurry vision, no floaters, no recent vision changes,  wears glasses            ENT:  no hearing loss, no loose or painful teeth, no dentures, last saw dentist 1 year ago. Was supposed to have appt today with his dentist.             Hematologic:               no easy bruising, no abnormal bleeding, no clotting  disorder, no frequent epistaxis             Endocrine:                   + diabetes, does check CBG's at home                            Physical Exam:               BP 107/74   Pulse 90   Resp 20   Ht 5\' 7"  (1.702 m)   Wt 135 lb (61.2 kg)   SpO2 95% Comment: RA  BMI 21.14 kg/m              General:                      Thin,  well-appearing             HEENT:                       Unremarkable, NCAT, PERLA, EOMI             Neck:                           no JVD, no bruits, no adenopathy              Chest:                          clear to auscultation, symmetrical breath sounds, no wheezes, no rhonchi              CV:                              RRR, 3/6 systolic murmur RSB, No diastolic murmur             Abdomen:                    soft, non-tender, no masses              Extremities:                 warm, well-perfused, pulses palpable right ankle but not left, no lower extremity edema             Rectal/GU                   Deferred             Neuro:                         Grossly non-focal and symmetrical throughout             Skin:                            Clean and dry, no rashes,  no breakdown   Diagnostic Tests:     ECHOCARDIOGRAM REPORT         Patient Name:   Robert Li Date of Exam: 08/15/2020  Medical Rec #:  [id number]         Height:       68.0 in  Accession #:    [id number]        Weight:       139.0 lb  Date of Birth:  1955-03-29        BSA:          1.751 m  Patient Age:    64 years          BP:           130/69 mmHg  Patient Gender: M                 HR:           89 bpm.  Exam Location:  [facility]   Procedure: 2D Echo   Indications:    Aortic valve calcification [I35.9 (ICD-10-CM)]; Coronary  artery                  calcification seen on CT scan [I25.10 (ICD-10-CM)];  Thoracic                  aorta atherosclerosis (HCC) [I70.0 (ICD-10-CM)]     History:        Patient has no prior history of Echocardiogram  examinations.                   Coronary artery calcification seen on CT scan, Thoracic  aorta                  atherosclerosis, Aortic valve calcification; Risk                  Factors:Hypertension, Diabetes and Dyslipidemia.     Sonographer:    Louie Boston  Referring Phys: [phone] BRIAN J MUNLEY   IMPRESSIONS     1. Left ventricular ejection fraction, by estimation, is 60 to 65%. The  left ventricle has normal function. The left ventricle has no regional  wall motion abnormalities. There is severe left ventricular hypertrophy.  Left ventricular diastolic parameters   are consistent with Grade I diastolic dysfunction (impaired relaxation).   2. The mitral valve is normal in structure. No evidence of mitral valve  regurgitation. No evidence of mitral stenosis.   3. Possibility of bicuspid valve cannot be ruled out.. The aortic valve  was not well visualized. Aortic valve regurgitation is not visualized.  Severe aortic valve stenosis.   4. The inferior vena cava is normal in size with greater than 50%  respiratory variability, suggesting right atrial pressure of 3 mmHg.   FINDINGS   Left Ventricle: Left ventricular ejection fraction, by estimation, is 60  to 65%. The left ventricle has normal function. The left ventricle has no  regional wall motion abnormalities. The left ventricular internal cavity  size was normal in size. There is   severe left ventricular hypertrophy. Left ventricular diastolic  parameters are consistent with Grade I diastolic dysfunction (impaired  relaxation).   Right Ventricle: The right ventricular size is normal. No increase in  right ventricular wall thickness. Right ventricular systolic function is  normal. There is normal pulmonary artery systolic pressure. The tricuspid  regurgitant velocity is 2.70 m/s, and   with an assumed right atrial pressure of 3 mmHg,  the estimated right  ventricular systolic pressure is 32.2 mmHg.   Left Atrium: Left atrial size was normal in size.    Right Atrium: Right atrial size was normal in size.   Pericardium: There is no evidence of pericardial effusion.   Mitral Valve: The mitral valve is normal in structure. No evidence of  mitral valve regurgitation. No evidence of mitral valve stenosis.   Tricuspid Valve: The tricuspid valve is normal in structure. Tricuspid  valve regurgitation is mild . No evidence of tricuspid stenosis.   Aortic Valve: Possibility of bicuspid valve cannot be ruled out. The  aortic valve was not well visualized. Aortic valve regurgitation is not  visualized. Severe aortic stenosis is present. Aortic valve mean gradient  measures 87.0 mmHg. Aortic valve peak  gradient measures 125.9 mmHg. Aortic valve area, by VTI measures 0.48 cm.   Pulmonic Valve: The pulmonic valve was normal in structure. Pulmonic valve  regurgitation is not visualized. No evidence of pulmonic stenosis.   Aorta: The aortic root is normal in size and structure.   Venous: The inferior vena cava is normal in size with greater than 50%  respiratory variability, suggesting right atrial pressure of 3 mmHg.   IAS/Shunts: No atrial level shunt detected by color flow Doppler.      LEFT VENTRICLE  PLAX 2D  LVIDd:         3.80 cm  Diastology  LVIDs:         2.90 cm  LV e' medial:    3.48 cm/s  LV PW:         1.80 cm  LV E/e' medial:  20.4  LV IVS:        2.10 cm  LV e' lateral:   3.92 cm/s  LVOT diam:     2.10 cm  LV E/e' lateral: 18.1  LV SV:         64  LV SV Index:   36  LVOT Area:     3.46 cm      RIGHT VENTRICLE            IVC  RV S prime:     8.59 cm/s  IVC diam: 1.20 cm  TAPSE (M-mode): 1.4 cm   LEFT ATRIUM             Index       RIGHT ATRIUM          Index  LA diam:        2.90 cm 1.66 cm/m  RA Area:     9.70 cm  LA Vol (A2C):   44.7 ml 25.53 ml/m RA Volume:   20.30 ml 11.59 ml/m  LA Vol (A4C):   29.9 ml 17.08 ml/m  LA Biplane Vol: 36.5 ml 20.85 ml/m   AORTIC VALVE  AV Area (Vmax):    0.39 cm  AV Area  (Vmean):   0.35 cm  AV Area (VTI):     0.48 cm  AV Vmax:           561.00 cm/s  AV Vmean:          450.000 cm/s  AV VTI:            1.340 m  AV Peak Grad:      125.9 mmHg  AV Mean Grad:      87.0 mmHg  LVOT Vmax:         63.90 cm/s  LVOT Vmean:        46.100  cm/s  LVOT VTI:          0.184 m  LVOT/AV VTI ratio: 0.14     AORTA  Ao Root diam: 3.30 cm  Ao Asc diam:  3.30 cm  Ao Desc diam: 2.10 cm   MITRAL VALVE                TRICUSPID VALVE  MV Area (PHT): 4.99 cm     TR Peak grad:   29.2 mmHg  MV Decel Time: 152 msec     TR Vmax:        270.00 cm/s  MV E velocity: 71.00 cm/s  MV A velocity: 135.00 cm/s  SHUNTS  MV E/A ratio:  0.53         Systemic VTI:  0.18 m                              Systemic Diam: 2.10 cm   Belva Crome MD  Electronically signed by Belva Crome MD  Signature Date/Time: 08/15/2020/10:46:05 AM       Physicians   Panel Physicians Referring Physician Case Authorizing Physician  Kathleene Hazel, MD (Primary)          Procedures   RIGHT HEART CATH AND CORONARY/GRAFT ANGIOGRAPHY      Conclusion     Ost LAD to Prox LAD lesion is 20% stenosed. Prox RCA lesion is 20% stenosed.   1. Mild heavily calcified disease in the proximal LAD with 20% stenosis. 2. Mild proximal RCA disease.   Recommendations: He has critical aortic stenosis by echo. I did not cross his valve in the cath lab. He would be a candidate for TAVR or surgical AVR. Will proceed with CT scans next and then have him seen by Dr. Laneta Simmers in the CT surgery office.         Indications   Coronary artery disease involving native coronary artery of native heart without angina pectoris [I25.10 (ICD-10-CM)]  Severe aortic stenosis [I35.0 (ICD-10-CM)]      Procedural Details   Technical Details Indication: Severe aortic stenosis  Procedure: The risks, benefits, complications, treatment options, and expected outcomes were discussed with the patient. The patient and/or family  concurred with the proposed plan, giving informed consent. The patient was brought to the cath lab after IV hydration was given. The patient was sedated with Versed and Fentanyl. The right groin was prepped and draped. A 7 Fr sheath was placed in the right femoral vein using u/s guidance. 1% lidocaine used for local anesthesia. Right heart catheterization performed with a balloon tipped catheter. The right wrist was prepped and draped in a sterile fashion. 1% lidocaine was used for local anesthesia. Using the modified Seldinger access technique, a 5 French sheath was placed in the right radial artery. 3 mg Verapamil was given through the sheath. 3500 units IV heparin was given. Standard diagnostic catheters were used to perform selective coronary angiography. I did not cross the aortic valve.  The sheath was removed from the right radial artery and a Terumo hemostasis band was applied at the arteriotomy site on the right wrist.    Estimated blood loss <50 mL.   During this procedure medications were administered to achieve and maintain moderate conscious sedation while the patient's heart rate, blood pressure, and oxygen saturation were continuously monitored and I was present face-to-face 100% of this time.      Medications (Filter: Administrations occurring from [phone] to  4098 on 09/01/20)  important  Continuous medications are totaled by the amount administered until 09/01/20 0844.      fentaNYL (SUBLIMAZE) injection (mcg) Total dose:  75 mcg  Date/Time Rate/Dose/Volume Action    09/01/20 0744 50 mcg Given    0759 25 mcg Given      midazolam (VERSED) injection (mg) Total dose:  3 mg  Date/Time Rate/Dose/Volume Action    09/01/20 0744 2 mg Given    0800 1 mg Given      lidocaine (PF) (XYLOCAINE) 1 % injection (mL) Total volume:  14 mL  Date/Time Rate/Dose/Volume Action    09/01/20 0755 2 mL Given    0755 2 mL Given    0808 10 mL Given      Heparin (Porcine) in NaCl 1000-0.9  UT/500ML-% SOLN (mL) Total volume:  1,000 mL  Date/Time Rate/Dose/Volume Action    09/01/20 0759 500 mL Given    0759 500 mL Given      Radial Cocktail/Verapamil only (mL) Total volume:  10 mL  Date/Time Rate/Dose/Volume Action    09/01/20 0806 10 mL Given      heparin sodium (porcine) injection (Units) Total dose:  3,500 Units  Date/Time Rate/Dose/Volume Action    09/01/20 0819 3,500 Units Given      iohexol (OMNIPAQUE) 350 MG/ML injection (mL) Total volume:  55 mL  Date/Time Rate/Dose/Volume Action    09/01/20 0833 55 mL Given        Sedation Time   Sedation Time Physician-1: 46 minutes 45 seconds     Contrast   Medication Name Total Dose  iohexol (OMNIPAQUE) 350 MG/ML injection 55 mL      Radiation/Fluoro   Fluoro time: 9.2 (min) DAP: 9.1 (Gycm2) Cumulative Air Kerma: 166.5 (mGy)   Complications        Complications documented before study signed (09/01/2020  8:58 AM)         RIGHT HEART CATH AND CORONARY/GRAFT ANGIOGRAPHY   None Documented by Kathleene Hazel, MD 09/01/2020  8:56 AM  Date Found: 09/01/2020  Time Range: Intraprocedure            Coronary Findings     Diagnostic Dominance: Right   Left Anterior Descending  Vessel is large.  Ost LAD to Prox LAD lesion is 20% stenosed. The lesion is calcified.  Left Circumflex  Vessel is large.  Right Coronary Artery  Vessel is large.  Prox RCA lesion is 20% stenosed.    Intervention     No interventions have been documented.                     Coronary Diagrams     Diagnostic Dominance: Right      Intervention         Implants      No implant documentation for this case.      [employer]ng[employer] Images    Show images for CARDIAC CATHETERIZATION   Images on Long Term Storage    Show images for Marian, Grandt to Procedure Log   Procedure Log        Hemo Data   Flowsheet Row Most Recent Value  Fick Cardiac Output 4.38 L/min  Fick Cardiac Output  Index 2.56 (L/min)/BSA  RA A Wave 3 mmHg  RA V Wave 1 mmHg  RA Mean 0 mmHg  RV Systolic Pressure 24 mmHg  RV Diastolic Pressure -3 mmHg  RV EDP 1 mmHg  PA Systolic Pressure 22 mmHg  PA Diastolic Pressure 6 mmHg  PA Mean 12 mmHg  PW A Wave 4 mmHg  PW V Wave 3 mmHg  PW Mean 2 mmHg  AO Systolic Pressure 108 mmHg  AO Diastolic Pressure 71 mmHg  AO Mean 87 mmHg  QP/QS 1  TPVR Index 4.68 HRUI  TSVR Index 33.93 HRUI  TPVR/TSVR Ratio 0.14      ADDENDUM REPORT: 09/08/2020 12:31   CLINICAL DATA:  82O with severe aortic stenosis being evaluated for a TAVR procedure.   EXAM: Cardiac TAVR CT   TECHNIQUE: The patient was scanned on a [employer]. A 120 kV retrospective scan was triggered in the descending thoracic aorta at 111 HU's. Gantry rotation speed was 250 msecs and collimation was .6 mm. No beta blockade or nitro were given. The 3D data set was reconstructed in 5% intervals of the R-R cycle. Systolic and diastolic phases were analyzed on a dedicated work station using MPR, MIP and VRT modes. The patient received 80 cc of contrast.   FINDINGS: Aortic Root:   Aortic valve: Bicuspid aortic valve, Sievers Type 1 with fusion of left and right cusps   Aortic valve calcium score: 5682   Aortic annulus:   Diameter: 55mm x 93mm   Perimeter: 80mm   Area: 528 mm^2   Calcifications: Moderate calcification adjacent to RCC   Coronary height: Min Left - 57mm, Max Left - 65mm; Min Right - 65mm   Sinotubular height: Left cusp - 79mm; Right cusp - 51mm; Noncoronary cusp - 27mm   LVOT (as measured 3 mm below the annulus):   Diameter: 30mm x 28mm   Area: 547 mm^2   Calcifications: Mild calcification beneath RCC cusp   Aortic sinus width: Left cusp - 74mm; Right cusp - 65mm; Noncoronary cusp - 53mm   Sinotubular junction width: 61mm x 9mm   Optimum Fluoroscopic Angle for Delivery: RAO 2 CAU 26   Cardiac:   Right atrium: Normal size   Right ventricle:  Normal size   Pulmonary arteries: Normal size   Pulmonary veins: Normal configuration   Left atrium: Mild enlargement   Left ventricle: Normal size, moderate hypertrophy   Pericardium: Normal thickness   Coronary arteries: Calcium score 838 (88th percentile)   IMPRESSION: 1. Bicuspid aortic valve, Sievers Type 1 with fusion of left and right cusps. Severely calcified (AV calcium score 5682)   2. Aortic annulus measures 81mm x 57mm with perimeter 39mm and area 528 mm^2. Moderate annular calcification adjacent to RCC and extending into LVOT. Annular measurements suitable for delivery of 60mm Edwards Sapien 3 valve   3. Sufficient coronary to annulus distance, measuring 6mm to left main and 23mm to RCA   4.  Optimum Fluoroscopic Angle for Delivery:  RAO 2 CAU 26   5.  Coronary calcium score 838 (88th percentile)     Electronically Signed   By: Epifanio Lesches MD   On: 09/08/2020 12:31    Addended by Little Ishikawa, MD on 09/08/2020 12:33 PM      Study Result   Narrative & Impression  EXAM: OVER-READ INTERPRETATION  CT CHEST   The following report is an over-read performed by radiologist Dr. Trudie Reed of [facility] on 09/08/2020. This over-read does not include interpretation of cardiac or coronary anatomy or pathology. The coronary calcium score/coronary CTA interpretation by the cardiologist is attached.   COMPARISON:  None.   FINDINGS: Extracardiac findings will be described separately under dictation for contemporaneously obtained CTA chest, abdomen  and pelvis.   IMPRESSION: Please see separate dictation for contemporaneously obtained CTA chest, abdomen and pelvis dated 09/08/2020 for full description of relevant extracardiac findings.   Electronically Signed: By: Trudie Reed M.D. On: 09/08/2020 10:07      Narrative & Impression  CLINICAL DATA:  65 year old male with history of severe aortic stenosis.  Preprocedural study prior to potential transcatheter aortic valve replacement (TAVR) procedure.   EXAM: CT ANGIOGRAPHY CHEST, ABDOMEN AND PELVIS   TECHNIQUE: Multidetector CT imaging through the chest, abdomen and pelvis was performed using the standard protocol during bolus administration of intravenous contrast. Multiplanar reconstructed images and MIPs were obtained and reviewed to evaluate the vascular anatomy.   CONTRAST:  95 mL of Omnipaque 350.   COMPARISON:  Cardiac CT 08/12/2020.   FINDINGS: CTA CHEST FINDINGS   Cardiovascular: Heart size is normal. There is no significant pericardial fluid, thickening or pericardial calcification. There is aortic atherosclerosis, as well as atherosclerosis of the great vessels of the mediastinum and the coronary arteries, including calcified atherosclerotic plaque in the left main, left anterior descending, left circumflex and right coronary arteries. Severe thickening calcification of the aortic valve.   Mediastinum/Lymph Nodes: No pathologically enlarged mediastinal or hilar lymph nodes. Several densely calcified right hilar lymph nodes are incidentally noted. Esophagus is unremarkable in appearance. No axillary lymphadenopathy.   Lungs/Pleura: No suspicious appearing pulmonary nodules or masses are noted. No acute consolidative airspace disease. No pleural effusions.   Musculoskeletal/Soft Tissues: There are no aggressive appearing lytic or blastic lesions noted in the visualized portions of the skeleton.   CTA ABDOMEN AND PELVIS FINDINGS   Hepatobiliary: 1.2 x 1.0 cm low-attenuation lesion in segment 7 of the liver, compatible with a simple cyst. No other suspicious hepatic lesions. No intra or extrahepatic biliary ductal dilatation. Gallbladder is normal in appearance.   Pancreas: No pancreatic mass. No pancreatic ductal dilatation. No pancreatic or peripancreatic fluid collections or inflammatory changes.   Spleen:  Unremarkable.   Adrenals/Urinary Tract: Bilateral kidneys and adrenal glands are normal in appearance. No hydroureteronephrosis. Urinary bladder is unremarkable in appearance.   Stomach/Bowel: The appearance of the stomach is normal. There is no pathologic dilatation of small bowel or colon. Normal appendix.   Vascular/Lymphatic: Vascular findings and measurements pertinent to potential TAVR procedure, as detailed below. Extensive atherosclerosis of the abdominal aorta and pelvic vasculature. No lymphadenopathy noted in the abdomen or pelvis.   Reproductive: Prostate gland and seminal vesicles are unremarkable in appearance.   Other: No significant volume of ascites.  No pneumoperitoneum.   Musculoskeletal: There are no aggressive appearing lytic or blastic lesions noted in the visualized portions of the skeleton.   VASCULAR MEASUREMENTS PERTINENT TO TAVR:   AORTA:   Minimal Aortic Diameter-9 x 8 mm mm   Severity of Aortic Calcification-severe   RIGHT PELVIS:   Right Common Iliac Artery -   Minimal Diameter-6.4 x 4.7 mm   Tortuosity-mild   Calcification-moderate   Right External Iliac Artery -   Minimal Diameter-4.9 x 3.8 mm   Tortuosity-moderate   Calcification-moderate   Right Common Femoral Artery -   Minimal Diameter-7.2 x 5.1 mm   Tortuosity-mild   Calcification-moderate   LEFT PELVIS:   Left Common Iliac Artery -   Minimal Diameter-3.4 x 1.9 mm   Tortuosity-mild   Calcification-moderate   Comment- ectatic vessel measuring up to 1.4 cm in diameter with large amount of atheromatous plaque and/or mural thrombus causing near complete occlusion.   Left External Iliac Artery -  Minimal Diameter-5.0 x 4.0 mm   Tortuosity-mild   Calcification-moderate   Left Common Femoral Artery -   Minimal Diameter-4.9 x 4.3 mm   Tortuosity-mild   Calcification-moderate   Review of the MIP images confirms the above findings.   IMPRESSION: 1.  Vascular findings and measurements pertinent to potential TAVR procedure, as detailed above. 2. Severe thickening calcification of the aortic valve, compatible with reported clinical history of severe aortic stenosis. 3. Aortic atherosclerosis, in addition to left main and 3 vessel coronary artery disease. 4. Additional incidental findings, as above.     Electronically Signed   By: Trudie Reed M.D.   On: 09/08/2020 10:40        Impression:   This 65 year old gentleman has stage C, asymptomatic critical aortic stenosis although his family does note some exertional intolerance.  His 2D echocardiogram shows a severely calcified aortic valve with a mean gradient of 87 mmHg and a peak gradient of 126 mmHg with a valve area of 0.48 cm consistent with critical aortic stenosis.  Left ventricular systolic function is preserved with severe concentric LVH.  He has mild insignificant coronary disease on catheterization.  His gated cardiac CTA shows a severely calcified bicuspid aortic valve with fusion of left and right cusp and a calcium score of 5682.  There is moderate annular calcium adjacent to the right coronary cusp and extending into the LVOT.  I think the best option for treating this patient would be open surgical aortic valve replacement using a bioprosthetic valve.  I discussed transcatheter aortic valve replacement with him and his wife including the relative contraindication with severe bulky calcification of a bicuspid aortic valve as well as annular calcium extending into the LVOT.  I think his risk for paravalvular leak and coronary obstruction due to the bulky calcification would be increased.  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  I discussed the option of continued observation although I do not recommend it due to the risk of progressive left ventricular deterioration  and sudden death.  I reviewed the echo and cath films as well as the CT images with them and answered their questions. I discussed the operative procedure with the patient and family including alternatives, benefits and risks; including but not limited to bleeding, blood transfusion, infection, stroke, myocardial infarction, graft failure, heart block requiring a permanent pacemaker, organ dysfunction, and death.  [name] understands and agrees to proceed.     Plan:   Aortic valve replacement using a bioprosthetic valve. He will be referred to general vascular surgery after his valve has been replaced for further evaluation of his left common iliac artery occlusion.       [name], MD

## 2020-10-10 ENCOUNTER — Inpatient Hospital Stay (HOSPITAL_COMMUNITY): Payer: Medicare Other

## 2020-10-10 ENCOUNTER — Encounter (HOSPITAL_COMMUNITY)
Admission: RE | Disposition: A | Discharge status: Home / Self Care | Payer: Self-pay | Source: Home / Self Care | Attending: Surgery

## 2020-10-10 ENCOUNTER — Inpatient Hospital Stay (HOSPITAL_COMMUNITY): Payer: Medicare Other | Admitting: Certified Registered Nurse Anesthetist

## 2020-10-10 ENCOUNTER — Encounter (HOSPITAL_COMMUNITY): Payer: Self-pay | Admitting: Surgery

## 2020-10-10 ENCOUNTER — Inpatient Hospital Stay (HOSPITAL_COMMUNITY)
Admission: RE | Admit: 2020-10-10 | Discharge: 2020-10-15 | DRG: 219 | Disposition: A | Discharge status: Home Health | Payer: Medicare Other | Attending: Surgery | Admitting: Surgery

## 2020-10-10 DIAGNOSIS — E8770 Fluid overload, unspecified: Secondary | ICD-10-CM | POA: Diagnosis not present

## 2020-10-10 DIAGNOSIS — D696 Thrombocytopenia, unspecified: Secondary | ICD-10-CM | POA: Diagnosis present

## 2020-10-10 DIAGNOSIS — Q231 Congenital insufficiency of aortic valve: Secondary | ICD-10-CM | POA: Diagnosis not present

## 2020-10-10 DIAGNOSIS — Z833 Family history of diabetes mellitus: Secondary | ICD-10-CM

## 2020-10-10 DIAGNOSIS — Z79899 Other long term (current) drug therapy: Secondary | ICD-10-CM

## 2020-10-10 DIAGNOSIS — H5500 Unspecified nystagmus: Secondary | ICD-10-CM | POA: Diagnosis not present

## 2020-10-10 DIAGNOSIS — Z953 Presence of xenogenic heart valve: Secondary | ICD-10-CM

## 2020-10-10 DIAGNOSIS — Z8249 Family history of ischemic heart disease and other diseases of the circulatory system: Secondary | ICD-10-CM | POA: Diagnosis not present

## 2020-10-10 DIAGNOSIS — G9340 Encephalopathy, unspecified: Secondary | ICD-10-CM | POA: Diagnosis not present

## 2020-10-10 DIAGNOSIS — I700 Atherosclerosis of aorta: Secondary | ICD-10-CM | POA: Diagnosis present

## 2020-10-10 DIAGNOSIS — I4510 Unspecified right bundle-branch block: Secondary | ICD-10-CM | POA: Diagnosis present

## 2020-10-10 DIAGNOSIS — I10 Essential (primary) hypertension: Secondary | ICD-10-CM | POA: Diagnosis present

## 2020-10-10 DIAGNOSIS — Z888 Allergy status to other drugs, medicaments and biological substances status: Secondary | ICD-10-CM

## 2020-10-10 DIAGNOSIS — E1151 Type 2 diabetes mellitus with diabetic peripheral angiopathy without gangrene: Secondary | ICD-10-CM | POA: Diagnosis present

## 2020-10-10 DIAGNOSIS — F17210 Nicotine dependence, cigarettes, uncomplicated: Secondary | ICD-10-CM | POA: Diagnosis present

## 2020-10-10 DIAGNOSIS — J9690 Respiratory failure, unspecified, unspecified whether with hypoxia or hypercapnia: Secondary | ICD-10-CM | POA: Diagnosis not present

## 2020-10-10 DIAGNOSIS — Z01818 Encounter for other preprocedural examination: Secondary | ICD-10-CM

## 2020-10-10 DIAGNOSIS — Z7984 Long term (current) use of oral hypoglycemic drugs: Secondary | ICD-10-CM | POA: Diagnosis not present

## 2020-10-10 DIAGNOSIS — Z7982 Long term (current) use of aspirin: Secondary | ICD-10-CM | POA: Diagnosis not present

## 2020-10-10 DIAGNOSIS — E876 Hypokalemia: Secondary | ICD-10-CM | POA: Diagnosis not present

## 2020-10-10 DIAGNOSIS — D62 Acute posthemorrhagic anemia: Secondary | ICD-10-CM | POA: Diagnosis not present

## 2020-10-10 DIAGNOSIS — J939 Pneumothorax, unspecified: Secondary | ICD-10-CM

## 2020-10-10 DIAGNOSIS — R531 Weakness: Secondary | ICD-10-CM | POA: Diagnosis present

## 2020-10-10 DIAGNOSIS — I350 Nonrheumatic aortic (valve) stenosis: Principal | ICD-10-CM

## 2020-10-10 DIAGNOSIS — I2510 Atherosclerotic heart disease of native coronary artery without angina pectoris: Secondary | ICD-10-CM | POA: Diagnosis present

## 2020-10-10 DIAGNOSIS — E7800 Pure hypercholesterolemia, unspecified: Secondary | ICD-10-CM | POA: Diagnosis present

## 2020-10-10 DIAGNOSIS — Z20822 Contact with and (suspected) exposure to covid-19: Secondary | ICD-10-CM | POA: Diagnosis present

## 2020-10-10 DIAGNOSIS — Z952 Presence of prosthetic heart valve: Secondary | ICD-10-CM

## 2020-10-10 DIAGNOSIS — F1010 Alcohol abuse, uncomplicated: Secondary | ICD-10-CM | POA: Diagnosis present

## 2020-10-10 DIAGNOSIS — R404 Transient alteration of awareness: Secondary | ICD-10-CM

## 2020-10-10 HISTORY — DX: Nonrheumatic aortic (valve) stenosis: I35.0

## 2020-10-10 HISTORY — PX: TEE WITHOUT CARDIOVERSION: SHX5443

## 2020-10-10 HISTORY — PX: AORTIC VALVE REPLACEMENT: SHX41

## 2020-10-10 HISTORY — DX: Presence of xenogenic heart valve: Z95.3

## 2020-10-10 LAB — POCT I-STAT 7, (LYTES, BLD GAS, ICA,H+H)
Acid-Base Excess: 0 mmol/L (ref 0.0–2.0)
Acid-Base Excess: 0 mmol/L (ref 0.0–2.0)
Acid-base deficit: 5 mmol/L — ABNORMAL HIGH (ref 0.0–2.0)
Acid-base deficit: 2 mmol/L (ref 0.0–2.0)
Acid-base deficit: 1 mmol/L (ref 0.0–2.0)
Acid-base deficit: 3 mmol/L — ABNORMAL HIGH (ref 0.0–2.0)
Acid-base deficit: 5 mmol/L — ABNORMAL HIGH (ref 0.0–2.0)
Acid-base deficit: 8 mmol/L — ABNORMAL HIGH (ref 0.0–2.0)
Acid-base deficit: 3 mmol/L — ABNORMAL HIGH (ref 0.0–2.0)
Bicarbonate: 18.6 mmol/L — ABNORMAL LOW (ref 20.0–28.0)
Bicarbonate: 23 mmol/L (ref 20.0–28.0)
Bicarbonate: 24.2 mmol/L (ref 20.0–28.0)
Bicarbonate: 22.7 mmol/L (ref 20.0–28.0)
Bicarbonate: 22.9 mmol/L (ref 20.0–28.0)
Bicarbonate: 20.3 mmol/L (ref 20.0–28.0)
Bicarbonate: 17.7 mmol/L — ABNORMAL LOW (ref 20.0–28.0)
Bicarbonate: 25.7 mmol/L (ref 20.0–28.0)
Bicarbonate: 20.5 mmol/L (ref 20.0–28.0)
Calcium, Ion: 1.17 mmol/L (ref 1.15–1.40)
Calcium, Ion: 0.96 mmol/L — ABNORMAL LOW (ref 1.15–1.40)
Calcium, Ion: 0.99 mmol/L — ABNORMAL LOW (ref 1.15–1.40)
Calcium, Ion: 0.95 mmol/L — ABNORMAL LOW (ref 1.15–1.40)
Calcium, Ion: 1.01 mmol/L — ABNORMAL LOW (ref 1.15–1.40)
Calcium, Ion: 0.97 mmol/L — ABNORMAL LOW (ref 1.15–1.40)
Calcium, Ion: 0.99 mmol/L — ABNORMAL LOW (ref 1.15–1.40)
Calcium, Ion: 0.99 mmol/L — ABNORMAL LOW (ref 1.15–1.40)
Calcium, Ion: 0.9 mmol/L — ABNORMAL LOW (ref 1.15–1.40)
HCT: 47 % (ref 39.0–52.0)
HCT: 30 % — ABNORMAL LOW (ref 39.0–52.0)
HCT: 29 % — ABNORMAL LOW (ref 39.0–52.0)
HCT: 31 % — ABNORMAL LOW (ref 39.0–52.0)
HCT: 35 % — ABNORMAL LOW (ref 39.0–52.0)
HCT: 33 % — ABNORMAL LOW (ref 39.0–52.0)
HCT: 32 % — ABNORMAL LOW (ref 39.0–52.0)
HCT: 33 % — ABNORMAL LOW (ref 39.0–52.0)
HCT: 27 % — ABNORMAL LOW (ref 39.0–52.0)
Hemoglobin: 16 g/dL (ref 13.0–17.0)
Hemoglobin: 10.2 g/dL — ABNORMAL LOW (ref 13.0–17.0)
Hemoglobin: 9.9 g/dL — ABNORMAL LOW (ref 13.0–17.0)
Hemoglobin: 10.5 g/dL — ABNORMAL LOW (ref 13.0–17.0)
Hemoglobin: 11.9 g/dL — ABNORMAL LOW (ref 13.0–17.0)
Hemoglobin: 11.2 g/dL — ABNORMAL LOW (ref 13.0–17.0)
Hemoglobin: 10.9 g/dL — ABNORMAL LOW (ref 13.0–17.0)
Hemoglobin: 11.2 g/dL — ABNORMAL LOW (ref 13.0–17.0)
Hemoglobin: 9.2 g/dL — ABNORMAL LOW (ref 13.0–17.0)
O2 Saturation: 96 %
O2 Saturation: 100 %
O2 Saturation: 100 %
O2 Saturation: 99 %
O2 Saturation: 96 %
O2 Saturation: 98 %
O2 Saturation: 100 %
O2 Saturation: 96 %
O2 Saturation: 100 %
Patient temperature: 36.2
Patient temperature: 35.8
Patient temperature: 36.2
Patient temperature: 36.5
Patient temperature: 36.5
Potassium: 3.8 mmol/L (ref 3.5–5.1)
Potassium: 3.6 mmol/L (ref 3.5–5.1)
Potassium: 3.9 mmol/L (ref 3.5–5.1)
Potassium: 4.3 mmol/L (ref 3.5–5.1)
Potassium: 3.6 mmol/L (ref 3.5–5.1)
Potassium: 4 mmol/L (ref 3.5–5.1)
Potassium: 3.9 mmol/L (ref 3.5–5.1)
Potassium: 4.1 mmol/L (ref 3.5–5.1)
Potassium: 3.4 mmol/L — ABNORMAL LOW (ref 3.5–5.1)
Sodium: 141 mmol/L (ref 135–145)
Sodium: 138 mmol/L (ref 135–145)
Sodium: 139 mmol/L (ref 135–145)
Sodium: 139 mmol/L (ref 135–145)
Sodium: 142 mmol/L (ref 135–145)
Sodium: 142 mmol/L (ref 135–145)
Sodium: 142 mmol/L (ref 135–145)
Sodium: 143 mmol/L (ref 135–145)
Sodium: 145 mmol/L (ref 135–145)
TCO2: 20 mmol/L — ABNORMAL LOW (ref 22–32)
TCO2: 24 mmol/L (ref 22–32)
TCO2: 26 mmol/L (ref 22–32)
TCO2: 24 mmol/L (ref 22–32)
TCO2: 24 mmol/L (ref 22–32)
TCO2: 21 mmol/L — ABNORMAL LOW (ref 22–32)
TCO2: 19 mmol/L — ABNORMAL LOW (ref 22–32)
TCO2: 27 mmol/L (ref 22–32)
TCO2: 22 mmol/L (ref 22–32)
pCO2 arterial: 31.7 mmHg — ABNORMAL LOW (ref 32.0–48.0)
pCO2 arterial: 40.3 mmHg (ref 32.0–48.0)
pCO2 arterial: 44 mmHg (ref 32.0–48.0)
pCO2 arterial: 31.2 mmHg — ABNORMAL LOW (ref 32.0–48.0)
pCO2 arterial: 42.3 mmHg (ref 32.0–48.0)
pCO2 arterial: 34.5 mmHg (ref 32.0–48.0)
pCO2 arterial: 34.4 mmHg (ref 32.0–48.0)
pCO2 arterial: 44.6 mmHg (ref 32.0–48.0)
pCO2 arterial: 31.2 mmHg — ABNORMAL LOW (ref 32.0–48.0)
pH, Arterial: 7.377 (ref 7.350–7.450)
pH, Arterial: 7.364 (ref 7.350–7.450)
pH, Arterial: 7.349 — ABNORMAL LOW (ref 7.350–7.450)
pH, Arterial: 7.471 — ABNORMAL HIGH (ref 7.350–7.450)
pH, Arterial: 7.337 — ABNORMAL LOW (ref 7.350–7.450)
pH, Arterial: 7.373 (ref 7.350–7.450)
pH, Arterial: 7.317 — ABNORMAL LOW (ref 7.350–7.450)
pH, Arterial: 7.364 (ref 7.350–7.450)
pH, Arterial: 7.424 (ref 7.350–7.450)
pO2, Arterial: 83 mmHg (ref 83.0–108.0)
pO2, Arterial: 418 mmHg — ABNORMAL HIGH (ref 83.0–108.0)
pO2, Arterial: 261 mmHg — ABNORMAL HIGH (ref 83.0–108.0)
pO2, Arterial: 129 mmHg — ABNORMAL HIGH (ref 83.0–108.0)
pO2, Arterial: 81 mmHg — ABNORMAL LOW (ref 83.0–108.0)
pO2, Arterial: 104 mmHg (ref 83.0–108.0)
pO2, Arterial: 193 mmHg — ABNORMAL HIGH (ref 83.0–108.0)
pO2, Arterial: 86 mmHg (ref 83.0–108.0)
pO2, Arterial: 221 mmHg — ABNORMAL HIGH (ref 83.0–108.0)

## 2020-10-10 LAB — COMPREHENSIVE METABOLIC PANEL
ALT: 49 U/L — ABNORMAL HIGH (ref 0–44)
AST: 33 U/L (ref 15–41)
Albumin: 3.5 g/dL (ref 3.5–5.0)
Alkaline Phosphatase: 53 U/L (ref 38–126)
Anion gap: 10 (ref 5–15)
BUN: 15 mg/dL (ref 8–23)
CO2: 20 mmol/L — ABNORMAL LOW (ref 22–32)
Calcium: 8.3 mg/dL — ABNORMAL LOW (ref 8.9–10.3)
Chloride: 107 mmol/L (ref 98–111)
Creatinine, Ser: 0.8 mg/dL (ref 0.61–1.24)
GFR, Estimated: >60 mL/min (ref >60)
Glucose, Bld: 129 mg/dL — ABNORMAL HIGH (ref 70–99)
Potassium: 3.6 mmol/L (ref 3.5–5.1)
Sodium: 137 mmol/L (ref 135–145)
Total Bilirubin: 0.8 mg/dL (ref 0.3–1.2)
Total Protein: 5.8 g/dL — ABNORMAL LOW (ref 6.5–8.1)

## 2020-10-10 LAB — CBC
HCT: 44 % (ref 39.0–52.0)
HCT: 36.1 % — ABNORMAL LOW (ref 39.0–52.0)
HCT: 35.6 % — ABNORMAL LOW (ref 39.0–52.0)
HCT: 33.1 % — ABNORMAL LOW (ref 39.0–52.0)
Hemoglobin: 15 g/dL (ref 13.0–17.0)
Hemoglobin: 12.4 g/dL — ABNORMAL LOW (ref 13.0–17.0)
Hemoglobin: 11.8 g/dL — ABNORMAL LOW (ref 13.0–17.0)
Hemoglobin: 11.5 g/dL — ABNORMAL LOW (ref 13.0–17.0)
MCH: 32.8 pg (ref 26.0–34.0)
MCH: 33.3 pg (ref 26.0–34.0)
MCH: 32.4 pg (ref 26.0–34.0)
MCH: 33.3 pg (ref 26.0–34.0)
MCHC: 34.1 g/dL (ref 30.0–36.0)
MCHC: 34.3 g/dL (ref 30.0–36.0)
MCHC: 33.1 g/dL (ref 30.0–36.0)
MCHC: 34.7 g/dL (ref 30.0–36.0)
MCV: 96.1 fL (ref 80.0–100.0)
MCV: 97 fL (ref 80.0–100.0)
MCV: 97.8 fL (ref 80.0–100.0)
MCV: 95.9 fL (ref 80.0–100.0)
Platelets: 127 10*3/uL — ABNORMAL LOW (ref 150–400)
Platelets: 87 10*3/uL — ABNORMAL LOW (ref 150–400)
Platelets: 79 10*3/uL — ABNORMAL LOW (ref 150–400)
Platelets: 82 10*3/uL — ABNORMAL LOW (ref 150–400)
RBC: 4.58 MIL/uL (ref 4.22–5.81)
RBC: 3.72 MIL/uL — ABNORMAL LOW (ref 4.22–5.81)
RBC: 3.64 MIL/uL — ABNORMAL LOW (ref 4.22–5.81)
RBC: 3.45 MIL/uL — ABNORMAL LOW (ref 4.22–5.81)
RDW: 12.9 % (ref 11.5–15.5)
RDW: 12.7 % (ref 11.5–15.5)
RDW: 12.9 % (ref 11.5–15.5)
RDW: 12.8 % (ref 11.5–15.5)
WBC: 6.2 10*3/uL (ref 4.0–10.5)
WBC: 10.8 10*3/uL — ABNORMAL HIGH (ref 4.0–10.5)
WBC: 12.1 10*3/uL — ABNORMAL HIGH (ref 4.0–10.5)
WBC: 9.6 10*3/uL (ref 4.0–10.5)
nRBC: 0 % (ref 0.0–0.2)
nRBC: 0 % (ref 0.0–0.2)
nRBC: 0 % (ref 0.0–0.2)
nRBC: 0 % (ref 0.0–0.2)

## 2020-10-10 LAB — POCT I-STAT, CHEM 8
BUN: 15 mg/dL (ref 8–23)
BUN: 13 mg/dL (ref 8–23)
BUN: 11 mg/dL (ref 8–23)
BUN: 11 mg/dL (ref 8–23)
BUN: 11 mg/dL (ref 8–23)
Calcium, Ion: 1.15 mmol/L (ref 1.15–1.40)
Calcium, Ion: 1.16 mmol/L (ref 1.15–1.40)
Calcium, Ion: 0.95 mmol/L — ABNORMAL LOW (ref 1.15–1.40)
Calcium, Ion: 0.97 mmol/L — ABNORMAL LOW (ref 1.15–1.40)
Calcium, Ion: 1 mmol/L — ABNORMAL LOW (ref 1.15–1.40)
Chloride: 106 mmol/L (ref 98–111)
Chloride: 105 mmol/L (ref 98–111)
Chloride: 101 mmol/L (ref 98–111)
Chloride: 102 mmol/L (ref 98–111)
Chloride: 103 mmol/L (ref 98–111)
Creatinine, Ser: 0.7 mg/dL (ref 0.61–1.24)
Creatinine, Ser: 0.6 mg/dL — ABNORMAL LOW (ref 0.61–1.24)
Creatinine, Ser: 0.4 mg/dL — ABNORMAL LOW (ref 0.61–1.24)
Creatinine, Ser: 0.5 mg/dL — ABNORMAL LOW (ref 0.61–1.24)
Creatinine, Ser: 0.5 mg/dL — ABNORMAL LOW (ref 0.61–1.24)
Glucose, Bld: 130 mg/dL — ABNORMAL HIGH (ref 70–99)
Glucose, Bld: 149 mg/dL — ABNORMAL HIGH (ref 70–99)
Glucose, Bld: 116 mg/dL — ABNORMAL HIGH (ref 70–99)
Glucose, Bld: 112 mg/dL — ABNORMAL HIGH (ref 70–99)
Glucose, Bld: 112 mg/dL — ABNORMAL HIGH (ref 70–99)
HCT: 45 % (ref 39.0–52.0)
HCT: 41 % (ref 39.0–52.0)
HCT: 27 % — ABNORMAL LOW (ref 39.0–52.0)
HCT: 27 % — ABNORMAL LOW (ref 39.0–52.0)
HCT: 28 % — ABNORMAL LOW (ref 39.0–52.0)
Hemoglobin: 15.3 g/dL (ref 13.0–17.0)
Hemoglobin: 13.9 g/dL (ref 13.0–17.0)
Hemoglobin: 9.2 g/dL — ABNORMAL LOW (ref 13.0–17.0)
Hemoglobin: 9.2 g/dL — ABNORMAL LOW (ref 13.0–17.0)
Hemoglobin: 9.5 g/dL — ABNORMAL LOW (ref 13.0–17.0)
Potassium: 3.8 mmol/L (ref 3.5–5.1)
Potassium: 3.6 mmol/L (ref 3.5–5.1)
Potassium: 3.9 mmol/L (ref 3.5–5.1)
Potassium: 4.3 mmol/L (ref 3.5–5.1)
Potassium: 4.5 mmol/L (ref 3.5–5.1)
Sodium: 140 mmol/L (ref 135–145)
Sodium: 139 mmol/L (ref 135–145)
Sodium: 137 mmol/L (ref 135–145)
Sodium: 139 mmol/L (ref 135–145)
Sodium: 139 mmol/L (ref 135–145)
TCO2: 20 mmol/L — ABNORMAL LOW (ref 22–32)
TCO2: 23 mmol/L (ref 22–32)
TCO2: 23 mmol/L (ref 22–32)
TCO2: 23 mmol/L (ref 22–32)
TCO2: 24 mmol/L (ref 22–32)

## 2020-10-10 LAB — PLATELET COUNT: Platelets: 99 10*3/uL — ABNORMAL LOW (ref 150–400)

## 2020-10-10 LAB — POCT I-STAT EG7
Acid-base deficit: 3 mmol/L — ABNORMAL HIGH (ref 0.0–2.0)
Bicarbonate: 21.5 mmol/L (ref 20.0–28.0)
Calcium, Ion: 0.99 mmol/L — ABNORMAL LOW (ref 1.15–1.40)
HCT: 32 % — ABNORMAL LOW (ref 39.0–52.0)
Hemoglobin: 10.9 g/dL — ABNORMAL LOW (ref 13.0–17.0)
O2 Saturation: 84 %
Potassium: 3.5 mmol/L (ref 3.5–5.1)
Sodium: 139 mmol/L (ref 135–145)
TCO2: 23 mmol/L (ref 22–32)
pCO2, Ven: 36.9 mmHg — ABNORMAL LOW (ref 44.0–60.0)
pH, Ven: 7.373 (ref 7.250–7.430)
pO2, Ven: 50 mmHg — ABNORMAL HIGH (ref 32.0–45.0)

## 2020-10-10 LAB — TYPE AND SCREEN
ABO/RH(D): O NEG
Antibody Screen: NEGATIVE

## 2020-10-10 LAB — ECHO INTRAOPERATIVE TEE
AR max vel: 0.97 cm2
AV Area VTI: 0.99 cm2
AV Area mean vel: 1.01 cm2
AV Mean grad: 43.5 mmHg
AV Peak grad: 53.1 mmHg
Ao pk vel: 3.65 m/s
Height: 66 in
Weight: 2208 oz

## 2020-10-10 LAB — BASIC METABOLIC PANEL
Anion gap: 10 (ref 5–15)
Anion gap: 8 (ref 5–15)
BUN: 8 mg/dL (ref 8–23)
BUN: 9 mg/dL (ref 8–23)
CO2: 25 mmol/L (ref 22–32)
CO2: 23 mmol/L (ref 22–32)
Calcium: 6.6 mg/dL — ABNORMAL LOW (ref 8.9–10.3)
Calcium: 7.1 mg/dL — ABNORMAL LOW (ref 8.9–10.3)
Chloride: 106 mmol/L (ref 98–111)
Chloride: 107 mmol/L (ref 98–111)
Creatinine, Ser: 0.75 mg/dL (ref 0.61–1.24)
Creatinine, Ser: 0.78 mg/dL (ref 0.61–1.24)
GFR, Estimated: >60 mL/min (ref >60)
GFR, Estimated: >60 mL/min (ref >60)
Glucose, Bld: 167 mg/dL — ABNORMAL HIGH (ref 70–99)
Glucose, Bld: 137 mg/dL — ABNORMAL HIGH (ref 70–99)
Potassium: 4 mmol/L (ref 3.5–5.1)
Potassium: 3.9 mmol/L (ref 3.5–5.1)
Sodium: 141 mmol/L (ref 135–145)
Sodium: 138 mmol/L (ref 135–145)

## 2020-10-10 LAB — MAGNESIUM
Magnesium: 3 mg/dL — ABNORMAL HIGH (ref 1.7–2.4)
Magnesium: 3.1 mg/dL — ABNORMAL HIGH (ref 1.7–2.4)

## 2020-10-10 LAB — GLUCOSE, CAPILLARY
Glucose-Capillary: 148 mg/dL — ABNORMAL HIGH (ref 70–99)
Glucose-Capillary: 149 mg/dL — ABNORMAL HIGH (ref 70–99)
Glucose-Capillary: 104 mg/dL — ABNORMAL HIGH (ref 70–99)
Glucose-Capillary: 152 mg/dL — ABNORMAL HIGH (ref 70–99)
Glucose-Capillary: 172 mg/dL — ABNORMAL HIGH (ref 70–99)
Glucose-Capillary: 128 mg/dL — ABNORMAL HIGH (ref 70–99)

## 2020-10-10 LAB — HEMOGLOBIN A1C
Hgb A1c MFr Bld: 6.2 % — ABNORMAL HIGH (ref 4.8–5.6)
Mean Plasma Glucose: 131.24 mg/dL

## 2020-10-10 LAB — PROTIME-INR
INR: 1 (ref 0.8–1.2)
INR: 1.4 — ABNORMAL HIGH (ref 0.8–1.2)
INR: 1.3 — ABNORMAL HIGH (ref 0.8–1.2)
Prothrombin Time: 12.8 seconds (ref 11.4–15.2)
Prothrombin Time: 16.7 seconds — ABNORMAL HIGH (ref 11.4–15.2)
Prothrombin Time: 16 seconds — ABNORMAL HIGH (ref 11.4–15.2)

## 2020-10-10 LAB — APTT
aPTT: 31 seconds (ref 24–36)
aPTT: 41 seconds — ABNORMAL HIGH (ref 24–36)

## 2020-10-10 LAB — HEMOGLOBIN AND HEMATOCRIT, BLOOD
HCT: 30.1 % — ABNORMAL LOW (ref 39.0–52.0)
Hemoglobin: 10.4 g/dL — ABNORMAL LOW (ref 13.0–17.0)

## 2020-10-10 LAB — ABO/RH: ABO/RH(D): O NEG

## 2020-10-10 LAB — LACTIC ACID, PLASMA: Lactic Acid, Venous: 1.2 mmol/L (ref 0.5–1.9)

## 2020-10-10 SURGERY — REPLACEMENT, AORTIC VALVE, OPEN
Anesthesia: General | Site: Chest

## 2020-10-10 MED ORDER — LACTATED RINGERS IV SOLN: INTRAVENOUS | Status: DC

## 2020-10-10 MED ORDER — ACETAMINOPHEN 160 MG/5ML PO SOLN: 650.0000 mg | Freq: Once | ORAL | Status: DC

## 2020-10-10 MED ORDER — PLASMA-LYTE A IV SOLN
INTRAVENOUS | Status: DC | PRN
  Administered 2020-10-10: 1000 mL

## 2020-10-10 MED ORDER — ASPIRIN EC 325 MG PO TBEC
325.0000 mg | DELAYED_RELEASE_TABLET | Freq: Every day | ORAL | Status: DC
  Administered 2020-10-11 – 2020-10-15 (×5): 325 mg via ORAL
  Filled 2020-10-10 (×5): qty 1

## 2020-10-10 MED ORDER — BISACODYL 10 MG RE SUPP: 10.0000 mg | Freq: Every day | RECTAL | Status: DC

## 2020-10-10 MED ORDER — VANCOMYCIN HCL IN DEXTROSE 1-5 GM/200ML-% IV SOLN
1000.0000 mg | Freq: Once | INTRAVENOUS | Status: AC
  Administered 2020-10-10: 1000 mg via INTRAVENOUS

## 2020-10-10 MED ORDER — CHLORHEXIDINE GLUCONATE 0.12 % MT SOLN
15.0000 mL | OROMUCOSAL | Status: AC
  Administered 2020-10-10: 15 mL via OROMUCOSAL

## 2020-10-10 MED ORDER — ~~LOC~~ CARDIAC SURGERY, PATIENT & FAMILY EDUCATION
Freq: Once | Status: DC
  Filled 2020-10-10: qty 1

## 2020-10-10 MED ORDER — PROPOFOL 10 MG/ML IV BOLUS
INTRAVENOUS | Status: DC | PRN
  Administered 2020-10-10: 30 mg via INTRAVENOUS

## 2020-10-10 MED ORDER — OXYCODONE HCL 5 MG PO TABS
5.0000 mg | ORAL_TABLET | ORAL | Status: DC | PRN
  Administered 2020-10-11: 10 mg via ORAL
  Administered 2020-10-11: 5 mg via ORAL
  Administered 2020-10-12 – 2020-10-14 (×8): 10 mg via ORAL
  Filled 2020-10-10 (×8): qty 2
  Filled 2020-10-10: qty 1
  Filled 2020-10-10: qty 2

## 2020-10-10 MED ORDER — SODIUM BICARBONATE 8.4 % IV SOLN
100.0000 meq | Freq: Once | INTRAVENOUS | Status: AC
  Administered 2020-10-10: 100 meq via INTRAVENOUS

## 2020-10-10 MED ORDER — DEXTROSE 50 % IV SOLN: 0.0000 mL | INTRAVENOUS | Status: DC | PRN

## 2020-10-10 MED ORDER — PROPOFOL 10 MG/ML IV BOLUS
INTRAVENOUS | Status: DC | PRN
  Administered 2020-10-10: 100 mg via INTRAVENOUS

## 2020-10-10 MED ORDER — ORAL CARE MOUTH RINSE
15.0000 mL | OROMUCOSAL | Status: DC
  Administered 2020-10-10 – 2020-10-11 (×6): 15 mL via OROMUCOSAL

## 2020-10-10 MED ORDER — ACETAMINOPHEN 160 MG/5ML PO SOLN
1000.0000 mg | Freq: Four times a day (QID) | ORAL | Status: DC
  Administered 2020-10-10: 1000 mg
  Filled 2020-10-10: qty 40.6

## 2020-10-10 MED ORDER — FAMOTIDINE IN NACL 20-0.9 MG/50ML-% IV SOLN
20.0000 mg | Freq: Two times a day (BID) | INTRAVENOUS | Status: AC
  Administered 2020-10-10 – 2020-10-11 (×2): 20 mg via INTRAVENOUS
  Filled 2020-10-10 (×2): qty 50

## 2020-10-10 MED ORDER — LACTATED RINGERS IV SOLN: INTRAVENOUS | Status: DC | PRN

## 2020-10-10 MED ORDER — CHLORHEXIDINE GLUCONATE 0.12 % MT SOLN
15.0000 mL | Freq: Once | OROMUCOSAL | Status: AC
  Administered 2020-10-10: 15 mL via OROMUCOSAL
  Filled 2020-10-10: qty 15

## 2020-10-10 MED ORDER — MORPHINE SULFATE (PF) 2 MG/ML IV SOLN
1.0000 mg | INTRAVENOUS | Status: DC | PRN
  Administered 2020-10-10 (×2): 2 mg via INTRAVENOUS
  Administered 2020-10-10: 4 mg via INTRAVENOUS
  Administered 2020-10-11: 2 mg via INTRAVENOUS
  Administered 2020-10-11: 4 mg via INTRAVENOUS
  Filled 2020-10-10 (×3): qty 1
  Filled 2020-10-10 (×2): qty 2

## 2020-10-10 MED ORDER — PHENYLEPHRINE 40 MCG/ML (10ML) SYRINGE FOR IV PUSH (FOR BLOOD PRESSURE SUPPORT)
PREFILLED_SYRINGE | INTRAVENOUS | Status: DC | PRN
  Administered 2020-10-10 (×3): 40 ug via INTRAVENOUS
  Administered 2020-10-10: 80 ug via INTRAVENOUS
  Administered 2020-10-10: 40 ug via INTRAVENOUS
  Administered 2020-10-10: 80 ug via INTRAVENOUS

## 2020-10-10 MED ORDER — SODIUM CHLORIDE 0.9% FLUSH: 3.0000 mL | INTRAVENOUS | Status: DC | PRN

## 2020-10-10 MED ORDER — ACETAMINOPHEN 650 MG RE SUPP: 650.0000 mg | Freq: Once | RECTAL | Status: DC

## 2020-10-10 MED ORDER — CHLORHEXIDINE GLUCONATE 0.12 % MT SOLN: 15.0000 mL | Freq: Once | OROMUCOSAL | Status: DC

## 2020-10-10 MED ORDER — THROMBIN 20000 UNITS EX SOLR
CUTANEOUS | Status: DC | PRN
  Administered 2020-10-10: 20 mL via TOPICAL

## 2020-10-10 MED ORDER — SODIUM CHLORIDE 0.9 % IV SOLN: 250.0000 mL | INTRAVENOUS | Status: DC

## 2020-10-10 MED ORDER — ADULT MULTIVITAMIN W/MINERALS CH
1.0000 | ORAL_TABLET | Freq: Every morning | ORAL | Status: DC
  Administered 2020-10-11: 1 via ORAL
  Filled 2020-10-10: qty 1

## 2020-10-10 MED ORDER — THROMBIN (RECOMBINANT) 20000 UNITS EX SOLR
CUTANEOUS | Status: AC
  Filled 2020-10-10: qty 20000

## 2020-10-10 MED ORDER — BISACODYL 5 MG PO TBEC
10.0000 mg | DELAYED_RELEASE_TABLET | Freq: Every day | ORAL | Status: DC
  Administered 2020-10-11 – 2020-10-12 (×2): 10 mg via ORAL
  Filled 2020-10-10 (×2): qty 2

## 2020-10-10 MED ORDER — ORAL CARE MOUTH RINSE: 15.0000 mL | Freq: Once | OROMUCOSAL | Status: AC

## 2020-10-10 MED ORDER — CHLORHEXIDINE GLUCONATE CLOTH 2 % EX PADS
6.0000 | MEDICATED_PAD | Freq: Every day | CUTANEOUS | Status: DC
  Administered 2020-10-10 – 2020-10-15 (×6): 6 via TOPICAL

## 2020-10-10 MED ORDER — PROPOFOL 10 MG/ML IV BOLUS
INTRAVENOUS | Status: AC
  Filled 2020-10-10: qty 20

## 2020-10-10 MED ORDER — SODIUM CHLORIDE 0.45 % IV SOLN: INTRAVENOUS | Status: DC | PRN

## 2020-10-10 MED ORDER — METOPROLOL TARTRATE 5 MG/5ML IV SOLN: 2.5000 mg | INTRAVENOUS | Status: DC | PRN

## 2020-10-10 MED ORDER — FENTANYL CITRATE (PF) 100 MCG/2ML IJ SOLN
INTRAMUSCULAR | Status: AC
  Administered 2020-10-10: 50 ug
  Filled 2020-10-10: qty 2

## 2020-10-10 MED ORDER — HEPARIN SODIUM (PORCINE) 1000 UNIT/ML IJ SOLN
INTRAMUSCULAR | Status: AC
  Filled 2020-10-10: qty 1

## 2020-10-10 MED ORDER — 0.9 % SODIUM CHLORIDE (POUR BTL) OPTIME
TOPICAL | Status: DC | PRN
  Administered 2020-10-10: 5000 mL

## 2020-10-10 MED ORDER — CHLORHEXIDINE GLUCONATE 0.12% ORAL RINSE (MEDLINE KIT)
15.0000 mL | Freq: Two times a day (BID) | OROMUCOSAL | Status: DC
  Administered 2020-10-10 – 2020-10-11 (×2): 15 mL via OROMUCOSAL

## 2020-10-10 MED ORDER — ACETAMINOPHEN 500 MG PO TABS
1000.0000 mg | ORAL_TABLET | Freq: Four times a day (QID) | ORAL | Status: DC
  Administered 2020-10-11 – 2020-10-15 (×15): 1000 mg via ORAL
  Filled 2020-10-10 (×16): qty 2

## 2020-10-10 MED ORDER — PROTAMINE SULFATE 10 MG/ML IV SOLN
INTRAVENOUS | Status: DC | PRN
  Administered 2020-10-10: 220 mg via INTRAVENOUS

## 2020-10-10 MED ORDER — PANTOPRAZOLE SODIUM 40 MG PO TBEC
40.0000 mg | DELAYED_RELEASE_TABLET | Freq: Every day | ORAL | Status: DC
  Administered 2020-10-12 – 2020-10-15 (×4): 40 mg via ORAL
  Filled 2020-10-10 (×4): qty 1

## 2020-10-10 MED ORDER — FENTANYL CITRATE (PF) 100 MCG/2ML IJ SOLN: 100.0000 ug | Freq: Once | INTRAMUSCULAR | Status: DC

## 2020-10-10 MED ORDER — DOCUSATE SODIUM 100 MG PO CAPS
200.0000 mg | ORAL_CAPSULE | Freq: Every day | ORAL | Status: DC
  Administered 2020-10-11 – 2020-10-12 (×2): 200 mg via ORAL
  Filled 2020-10-10 (×3): qty 2

## 2020-10-10 MED ORDER — NITROGLYCERIN IN D5W 200-5 MCG/ML-% IV SOLN: 0.0000 ug/min | INTRAVENOUS | Status: DC

## 2020-10-10 MED ORDER — MIDAZOLAM HCL 2 MG/2ML IJ SOLN
2.0000 mg | INTRAMUSCULAR | Status: DC | PRN
  Administered 2020-10-10 – 2020-10-11 (×3): 2 mg via INTRAVENOUS
  Filled 2020-10-10 (×3): qty 2

## 2020-10-10 MED ORDER — ROCURONIUM BROMIDE 10 MG/ML (PF) SYRINGE
PREFILLED_SYRINGE | INTRAVENOUS | Status: DC | PRN
  Administered 2020-10-10: 50 mg via INTRAVENOUS
  Administered 2020-10-10: 100 mg via INTRAVENOUS
  Administered 2020-10-10: 50 mg via INTRAVENOUS

## 2020-10-10 MED ORDER — SODIUM CHLORIDE 0.9 % IV SOLN: INTRAVENOUS | Status: DC | PRN

## 2020-10-10 MED ORDER — TRAMADOL HCL 50 MG PO TABS
50.0000 mg | ORAL_TABLET | ORAL | Status: DC | PRN
  Administered 2020-10-11: 100 mg via ORAL
  Administered 2020-10-11 – 2020-10-14 (×3): 50 mg via ORAL
  Administered 2020-10-14: 100 mg via ORAL
  Filled 2020-10-10: qty 1
  Filled 2020-10-10 (×2): qty 2
  Filled 2020-10-10: qty 1
  Filled 2020-10-10: qty 2

## 2020-10-10 MED ORDER — MIDAZOLAM HCL 5 MG/5ML IJ SOLN
INTRAMUSCULAR | Status: DC | PRN
  Administered 2020-10-10: 2 mg via INTRAVENOUS
  Administered 2020-10-10: 1 mg via INTRAVENOUS
  Administered 2020-10-10: 3 mg via INTRAVENOUS
  Administered 2020-10-10 (×2): 2 mg via INTRAVENOUS

## 2020-10-10 MED ORDER — CHLORHEXIDINE GLUCONATE 4 % EX LIQD: 30.0000 mL | CUTANEOUS | Status: DC

## 2020-10-10 MED ORDER — POTASSIUM CHLORIDE 10 MEQ/50ML IV SOLN
10.0000 meq | INTRAVENOUS | Status: AC
  Administered 2020-10-10 (×3): 10 meq via INTRAVENOUS

## 2020-10-10 MED ORDER — SODIUM CHLORIDE 0.9% FLUSH
3.0000 mL | Freq: Two times a day (BID) | INTRAVENOUS | Status: DC
  Administered 2020-10-11: 3 mL via INTRAVENOUS
  Administered 2020-10-11: 10 mL via INTRAVENOUS

## 2020-10-10 MED ORDER — ALBUMIN HUMAN 5 % IV SOLN: INTRAVENOUS | Status: DC | PRN

## 2020-10-10 MED ORDER — HEPARIN SODIUM (PORCINE) 1000 UNIT/ML IJ SOLN
INTRAMUSCULAR | Status: DC | PRN
  Administered 2020-10-10: 22000 [IU] via INTRAVENOUS

## 2020-10-10 MED ORDER — METOPROLOL TARTRATE 12.5 MG HALF TABLET
12.5000 mg | ORAL_TABLET | Freq: Two times a day (BID) | ORAL | Status: DC
Start: 2020-10-10 — End: 2020-10-13
  Administered 2020-10-11 – 2020-10-12 (×2): 12.5 mg via ORAL
  Filled 2020-10-10 (×2): qty 1

## 2020-10-10 MED ORDER — ASPIRIN 81 MG PO CHEW: 324.0000 mg | CHEWABLE_TABLET | Freq: Every day | ORAL | Status: DC

## 2020-10-10 MED ORDER — LACTATED RINGERS IV SOLN: 500.0000 mL | Freq: Once | INTRAVENOUS | Status: DC | PRN

## 2020-10-10 MED ORDER — MAGNESIUM SULFATE 4 GM/100ML IV SOLN
4.0000 g | Freq: Once | INTRAVENOUS | Status: AC
  Administered 2020-10-10: 4 g via INTRAVENOUS
  Filled 2020-10-10: qty 100

## 2020-10-10 MED ORDER — METOPROLOL TARTRATE 12.5 MG HALF TABLET
12.5000 mg | ORAL_TABLET | Freq: Once | ORAL | Status: AC
  Administered 2020-10-10: 12.5 mg via ORAL
  Filled 2020-10-10: qty 1

## 2020-10-10 MED ORDER — DEXMEDETOMIDINE HCL IN NACL 400 MCG/100ML IV SOLN
0.4000 ug/kg/h | INTRAVENOUS | Status: DC
  Administered 2020-10-10: 0.4 ug/kg/h via INTRAVENOUS
  Administered 2020-10-10: 0.7 ug/kg/h via INTRAVENOUS
  Filled 2020-10-10 (×2): qty 100
  Filled 2020-10-10: qty 200

## 2020-10-10 MED ORDER — CEFAZOLIN SODIUM-DEXTROSE 2-4 GM/100ML-% IV SOLN
2.0000 g | Freq: Three times a day (TID) | INTRAVENOUS | Status: AC
Start: 2020-10-10 — End: 2020-10-12
  Administered 2020-10-10 – 2020-10-12 (×6): 2 g via INTRAVENOUS
  Filled 2020-10-10 (×7): qty 100

## 2020-10-10 MED ORDER — PLASMA-LYTE A IV SOLN
INTRAVENOUS | Status: DC | PRN
  Administered 2020-10-10: 1000 mL via INTRAVASCULAR

## 2020-10-10 MED ORDER — ROSUVASTATIN CALCIUM 5 MG PO TABS
5.0000 mg | ORAL_TABLET | Freq: Every day | ORAL | Status: DC
  Administered 2020-10-11 – 2020-10-14 (×4): 5 mg via ORAL
  Filled 2020-10-10 (×4): qty 1

## 2020-10-10 MED ORDER — SODIUM CHLORIDE 0.9 % IV SOLN
INTRAVENOUS | Status: DC
Start: 2020-10-10 — End: 2020-10-12

## 2020-10-10 MED ORDER — ONDANSETRON HCL 4 MG/2ML IJ SOLN
4.0000 mg | Freq: Four times a day (QID) | INTRAMUSCULAR | Status: DC | PRN
  Administered 2020-10-10 – 2020-10-13 (×2): 4 mg via INTRAVENOUS
  Filled 2020-10-10 (×2): qty 2

## 2020-10-10 MED ORDER — FENTANYL CITRATE (PF) 250 MCG/5ML IJ SOLN
INTRAMUSCULAR | Status: AC
  Filled 2020-10-10: qty 25

## 2020-10-10 MED ORDER — ALBUMIN HUMAN 5 % IV SOLN
250.0000 mL | INTRAVENOUS | Status: AC | PRN
  Administered 2020-10-10 (×3): 12.5 g via INTRAVENOUS
  Filled 2020-10-10 (×2): qty 250

## 2020-10-10 MED ORDER — IOHEXOL 350 MG/ML SOLN
60.0000 mL | Freq: Once | INTRAVENOUS | Status: AC | PRN
  Administered 2020-10-10: 60 mL via INTRAVENOUS

## 2020-10-10 MED ORDER — ACETAMINOPHEN 500 MG PO TABS
1000.0000 mg | ORAL_TABLET | Freq: Once | ORAL | Status: AC
  Administered 2020-10-10: 1000 mg via ORAL
  Filled 2020-10-10: qty 2

## 2020-10-10 MED ORDER — METOPROLOL TARTRATE 25 MG/10 ML ORAL SUSPENSION: 12.5000 mg | Freq: Two times a day (BID) | ORAL | Status: DC

## 2020-10-10 MED ORDER — MIDAZOLAM HCL (PF) 10 MG/2ML IJ SOLN
INTRAMUSCULAR | Status: AC
  Filled 2020-10-10: qty 2

## 2020-10-10 MED ORDER — FENTANYL CITRATE (PF) 250 MCG/5ML IJ SOLN
INTRAMUSCULAR | Status: DC | PRN
  Administered 2020-10-10: 50 ug via INTRAVENOUS
  Administered 2020-10-10: 100 ug via INTRAVENOUS
  Administered 2020-10-10: 50 ug via INTRAVENOUS
  Administered 2020-10-10 (×2): 100 ug via INTRAVENOUS
  Administered 2020-10-10: 250 ug via INTRAVENOUS
  Administered 2020-10-10: 200 ug via INTRAVENOUS
  Administered 2020-10-10: 100 ug via INTRAVENOUS
  Administered 2020-10-10: 50 ug via INTRAVENOUS
  Administered 2020-10-10: 250 ug via INTRAVENOUS

## 2020-10-10 MED ORDER — PHENYLEPHRINE HCL-NACL 20-0.9 MG/250ML-% IV SOLN: 0.0000 ug/min | INTRAVENOUS | Status: DC

## 2020-10-10 MED ORDER — INSULIN REGULAR(HUMAN) IN NACL 100-0.9 UT/100ML-% IV SOLN: INTRAVENOUS | Status: DC

## 2020-10-10 SURGICAL SUPPLY — 78 items
ADAPTER CARDIO PERF ANTE/RETRO (ADAPTER) ×4 IMPLANT
BAG DECANTER FOR FLEXI CONT (MISCELLANEOUS) ×4 IMPLANT
BLADE CLIPPER SURG (BLADE) ×4 IMPLANT
BLADE STERNUM SYSTEM 6 (BLADE) ×4 IMPLANT
BLADE SURG 15 STRL LF DISP TIS (BLADE) ×3 IMPLANT
BLADE SURG 15 STRL SS (BLADE) ×1
CANISTER SUCT 3000ML PPV (MISCELLANEOUS) ×4 IMPLANT
CANNULA GUNDRY RCSP 15FR (MISCELLANEOUS) ×4 IMPLANT
CATH HEART VENT LEFT (CATHETERS) ×3 IMPLANT
CATH ROBINSON RED A/P 18FR (CATHETERS) ×12 IMPLANT
CATH THORACIC 36FR (CATHETERS) ×4 IMPLANT
CATH THORACIC 36FR RT ANG (CATHETERS) ×4 IMPLANT
CNTNR URN SCR LID CUP LEK RST (MISCELLANEOUS) ×3 IMPLANT
CONT SPEC 4OZ STRL OR WHT (MISCELLANEOUS) ×1
CONTAINER PROTECT SURGISLUSH (MISCELLANEOUS) ×8 IMPLANT
COVER BACK TABLE 24X17X13 BIG (DRAPES) ×4 IMPLANT
COVER SURGICAL LIGHT HANDLE (MISCELLANEOUS) ×4 IMPLANT
DEVICE SUT CK QUICK LOAD MINI (Prosthesis & Implant Heart) ×8 IMPLANT
DRAPE CARDIOVASCULAR INCISE (DRAPES) ×1
DRAPE SRG 135X102X78XABS (DRAPES) ×3 IMPLANT
DRAPE WARM FLUID 44X44 (DRAPES) ×4 IMPLANT
DRSG COVADERM 4X14 (GAUZE/BANDAGES/DRESSINGS) ×4 IMPLANT
ELECT CAUTERY BLADE 6.4 (BLADE) ×4 IMPLANT
ELECT REM PT RETURN 9FT ADLT (ELECTROSURGICAL) ×8
ELECTRODE REM PT RTRN 9FT ADLT (ELECTROSURGICAL) ×6 IMPLANT
FELT TEFLON 1X6 (MISCELLANEOUS) ×4 IMPLANT
GAUZE 4X4 16PLY ~~LOC~~+RFID DBL (SPONGE) ×4 IMPLANT
GAUZE SPONGE 4X4 12PLY STRL (GAUZE/BANDAGES/DRESSINGS) ×4 IMPLANT
GAUZE SPONGE 4X4 12PLY STRL LF (GAUZE/BANDAGES/DRESSINGS) ×4 IMPLANT
GLOVE SURG ENC MOIS LTX SZ6 (GLOVE) IMPLANT
GLOVE SURG ENC MOIS LTX SZ6.5 (GLOVE) IMPLANT
GLOVE SURG ENC MOIS LTX SZ7 (GLOVE) IMPLANT
GLOVE SURG ENC MOIS LTX SZ7.5 (GLOVE) IMPLANT
GLOVE SURG MICRO LTX SZ6 (GLOVE) ×4 IMPLANT
GLOVE SURG MICRO LTX SZ7 (GLOVE) ×8 IMPLANT
GLOVE SURG UNDER POLY LF SZ6 (GLOVE) ×4 IMPLANT
GOWN STRL REUS W/ TWL LRG LVL3 (GOWN DISPOSABLE) ×21 IMPLANT
GOWN STRL REUS W/ TWL XL LVL3 (GOWN DISPOSABLE) ×3 IMPLANT
GOWN STRL REUS W/TWL LRG LVL3 (GOWN DISPOSABLE) ×7
GOWN STRL REUS W/TWL XL LVL3 (GOWN DISPOSABLE) ×1
HEMOSTAT POWDER SURGIFOAM 1G (HEMOSTASIS) ×12 IMPLANT
HEMOSTAT SURGICEL 2X14 (HEMOSTASIS) ×4 IMPLANT
KIT BASIN OR (CUSTOM PROCEDURE TRAY) ×4 IMPLANT
KIT CATH CPB BARTLE (MISCELLANEOUS) ×4 IMPLANT
KIT SUCTION CATH 14FR (SUCTIONS) ×4 IMPLANT
KIT SUT CK MINI COMBO 4X17 (Prosthesis & Implant Heart) ×4 IMPLANT
KIT TURNOVER KIT B (KITS) ×4 IMPLANT
NS IRRIG 1000ML POUR BTL (IV SOLUTION) ×24 IMPLANT
PACK E OPEN HEART (SUTURE) ×4 IMPLANT
PACK OPEN HEART (CUSTOM PROCEDURE TRAY) ×4 IMPLANT
PAD ARMBOARD 7.5X6 YLW CONV (MISCELLANEOUS) ×8 IMPLANT
POSITIONER HEAD DONUT 9IN (MISCELLANEOUS) ×4 IMPLANT
SET MPS 3-ND DEL (MISCELLANEOUS) ×4 IMPLANT
SPONGE T-LAP 18X18 ~~LOC~~+RFID (SPONGE) ×16 IMPLANT
SUT BONE WAX W31G (SUTURE) ×4 IMPLANT
SUT EB EXC GRN/WHT 2-0 V-5 (SUTURE) ×8 IMPLANT
SUT ETHIBON EXCEL 2-0 V-5 (SUTURE) ×8 IMPLANT
SUT ETHIBOND 2 0 SH (SUTURE) ×1
SUT ETHIBOND 2 0 SH 36X2 (SUTURE) ×3 IMPLANT
SUT ETHIBOND 2 0 V5 (SUTURE) ×8 IMPLANT
SUT ETHIBOND V-5 VALVE (SUTURE) ×8 IMPLANT
SUT PROLENE 3 0 SH DA (SUTURE) ×8 IMPLANT
SUT PROLENE 3 0 SH1 36 (SUTURE) ×4 IMPLANT
SUT PROLENE 4 0 RB 1 (SUTURE) ×5
SUT PROLENE 4-0 RB1 .5 CRCL 36 (SUTURE) ×15 IMPLANT
SUT STEEL 6MS V (SUTURE) ×8 IMPLANT
SUT VIC AB 1 CTX 36 (SUTURE) ×2
SUT VIC AB 1 CTX36XBRD ANBCTR (SUTURE) ×6 IMPLANT
SYSTEM SAHARA CHEST DRAIN ATS (WOUND CARE) ×4 IMPLANT
TAPE CLOTH SURG 4X10 WHT LF (GAUZE/BANDAGES/DRESSINGS) ×4 IMPLANT
TAPE PAPER 2X10 WHT MICROPORE (GAUZE/BANDAGES/DRESSINGS) ×4 IMPLANT
TOWEL GREEN STERILE (TOWEL DISPOSABLE) ×4 IMPLANT
TOWEL GREEN STERILE FF (TOWEL DISPOSABLE) ×4 IMPLANT
TRAY FOLEY SLVR 16FR TEMP STAT (SET/KITS/TRAYS/PACK) ×4 IMPLANT
UNDERPAD 30X36 HEAVY ABSORB (UNDERPADS AND DIAPERS) ×4 IMPLANT
VALVE AORTIC SZ23 INSP/RESIL (Prosthesis & Implant Heart) ×4 IMPLANT
VENT LEFT HEART 12002 (CATHETERS) ×4
WATER STERILE IRR 1000ML POUR (IV SOLUTION) ×8 IMPLANT

## 2020-10-10 NOTE — Code Documentation (Signed)
Stroke Response Nurse Documentation Code Documentation  OATHER MUILENBURG is a 65 y.o. male admitted to [facility] post aortic valve replacement on 10/10/20 with past medical hx of DM, HTN, HLD. Code stroke was activated by [facility] staff with a LKW of 1550 when the patient became hypertensive and started to desat requiring reintubation.   Stroke team arrived to patient room. Patient to CT with team. NIHSS 22, see documentation for details and code stroke times. Patient with decreased LOC, disoriented, left gaze preference , bilateral arm weakness, bilateral leg weakness, bilateral decreased sensation, Expressive aphasia , dysarthria , and Visual  neglect on exam. The following imaging was completed:   CT, CTA head and neck. Patient is not a candidate for tPA due to recent surgery.   Care/Plan: q2h vitals and mNIHSS; EEG; MRI once pacer wires removed. Bedside handoff with 2H RN Vincenza Hews.    Scarlette Slice K  Stroke Response RN

## 2020-10-10 NOTE — Procedures (Addendum)
Patient Name: Robert Li  MRN: [id number]  Epilepsy Attending: Charlsie Quest  Referring Physician/Provider: Jimmye Norman, NP Date: 10/10/2020 Duration: 27.16 mins  Patient history: 65 yo male who underwent aortic valve replacement today for severe AS who became unresponsive with unclear etiology. EEG to evaluate for seizure  Level of alertness: asleep/sedated  AEDs during EEG study: None  Technical aspects: This EEG study was done with scalp electrodes positioned according to the 10-20 International system of electrode placement. Electrical activity was acquired at a sampling rate of 500Hz  and reviewed with a high frequency filter of 70Hz  and a low frequency filter of 1Hz . EEG data were recorded continuously and digitally stored.   Description: EEG showed continuous generalized 3 to 6 Hz theta-delta slowing admixed with 15 to 18 Hz beta activity distributed symmetrically and diffusely. Hyperventilation and photic stimulation were not performed.     ABNORMALITY - Continuous slow, generalized  IMPRESSION: This study is suggestive of moderate to severe diffuse encephalopathy, nonspecific etiology but likely related to sedation. No seizures or epileptiform discharges were seen throughout the recording.  [name] 

## 2020-10-10 NOTE — Transfer of Care (Signed)
Immediate Anesthesia Transfer of Care Note  Patient: Robert Li  Procedure(s) Performed: AN AD HOC INTUBATION  Patient Location: ICU  Anesthesia Type:General  Level of Consciousness: Patient remains intubated per anesthesia plan  Airway & Oxygen Therapy: Patient remains intubated per anesthesia plan and Patient placed on Ventilator (see vital sign flow sheet for setting)  Post-op Assessment: Report given to RN and Post -op Vital signs reviewed and stable  Post vital signs: Reviewed and stable  Last Vitals:  Vitals Value Taken Time  BP 131/92 10/10/20 1600  Temp 36.2 C 10/10/20 1611  Pulse 80 10/10/20 1613  Resp 16 10/10/20 1613  SpO2 100 % 10/10/20 1613  Vitals shown include unvalidated device data.  Last Pain:  Vitals:   10/10/20 1451  TempSrc:   PainSc: 6          Complications: No notable events documented.

## 2020-10-10 NOTE — Procedures (Signed)
Extubation Procedure Note  Patient Details:   Name: Robert Li DOB: [birth year] MRN: [id number]   Airway Documentation:    Vent end date: 10/10/20 Vent end time: 1440   Evaluation  O2 sats: stable throughout Complications: No apparent complications Patient did tolerate procedure well. Bilateral Breath Sounds: Clear   Yes  Patient was extubated to a 4L  without any complications, dyspnea or stridor noted. Patient was instructed on IS x 5, highest goal reached was . NIF: -35, VC: 1L, positive cuff leak prior to extubation.   Chaitanya Amedee, Margaretmary Dys 10/10/2020, 2:10 PM

## 2020-10-10 NOTE — Progress Notes (Signed)
Patient was transported to CT & back to 2H13 without any complications.

## 2020-10-10 NOTE — Consult Note (Signed)
NAME:  Robert Li, MRN:  [id number], DOB:  [birth year], LOS: 0 ADMISSION DATE:  10/10/2020, CONSULTATION DATE:  10/10/20 REFERRING MD:  Laneta Simmers, CHIEF COMPLAINT:  Unresponsive   History of Present Illness:  65 year old man w/ hx of DM, HTN who presented for open aortic valve replacement uncomplicated postop course extubated.  He then became acutely unresponsive and was intubated without sedation.  GCS 3 with no cranial nerve response on my evaluation.  Discussed with TCTS and consulted.  Pertinent  Medical History  DM2 Critical AS CAD HTN Smoker  Significant Hospital Events: Including procedures, antibiotic start and stop dates in addition to other pertinent events   10/10/20 s/p AVR Episode of complete unresponsiveness, lack of respirations, intubated without sedation  Interim History / Subjective:  Consulted  Objective   Blood pressure 101/69, pulse 80, temperature (!) 97.3 F (36.3 C), resp. rate 16, height 5\' 6"  (1.676 m), weight 62.6 kg, SpO2 100 %. PAP: (20-31)/(11-17) 28/15 CO:  [3 L/min-3.2 L/min] 3.2 L/min CI:  [1.8 L/min/m2-1.9 L/min/m2] 1.9 L/min/m2  Vent Mode: PRVC FiO2 (%):  [40 %-100 %] 100 % Set Rate:  [4 bmp-16 bmp] 16 bmp Vt Set:  [510 mL] 510 mL PEEP:  [5 cmH20] 5 cmH20 Pressure Support:  [10 cmH20] 10 cmH20 Plateau Pressure:  [15 cmH20] 15 cmH20   Intake/Output Summary (Last 24 hours) at 10/10/2020 1714 Last data filed at 10/10/2020 1500 Gross per 24 hour  Intake 3623.51 ml  Output 2927 ml  Net 696.51 ml   Filed Weights   10/10/20 0552  Weight: 62.6 kg    Examination: GCS3 Lungs clear Sternal incision CDI Pulses present x 4 and warm Pupils nonreactive No cough/gag/corneals/doll's  Resolved Hospital Problem list   N/a  Assessment & Plan:  Acute comatose status- code stroke called, by time neurology came to room patient became more responsive and moved all 4 ext.  Stat CT head/neck without large occlusion.  ABG okay, CBG okay.   ?vertebral TIA?  Resp failure due to above  S/p aortic valve replacement  - Keep on vent overnight, RASS goal -1 to 0 - Serial neuro exams - Family updated - Will see tomorrow AM and likely extubate if looks as good as he does now  10/12/20 (right click and "Reselect all SmartList Selections" daily)  Per primary  Labs   CBC: Recent Labs  Lab 10/10/20 0700 10/10/20 0746 10/10/20 1019 10/10/20 1034 10/10/20 1110 10/10/20 1116 10/10/20 1211 10/10/20 1438 10/10/20 1617  WBC 6.2  --   --   --   --   --  10.8*  --  12.1*  HGB 15.0   < > 10.4*   < > 9.5* 10.5* 12.4* 11.2* 11.8*  HCT 44.0   < > 30.1*   < > 28.0* 31.0* 36.1* 33.0* 35.6*  MCV 96.1  --   --   --   --   --  97.0  --  97.8  PLT 127*  --  99*  --   --   --  87*  --  79*   < > = values in this interval not displayed.    Basic Metabolic Panel: Recent Labs  Lab 10/10/20 0700 10/10/20 0746 10/10/20 0848 10/10/20 0907 10/10/20 0935 10/10/20 1004 10/10/20 1034 10/10/20 1110 10/10/20 1116 10/10/20 1438 10/10/20 1617  NA 137   < > 139   < > 137   < > 139 139 139 142 141  K 3.6   < >  3.6   < > 3.9   < > 4.5 4.3 4.3 4.0 4.0  CL 107   < > 105  --  101  --  103 102  --   --  106  CO2 20*  --   --   --   --   --   --   --   --   --  25  GLUCOSE 129*   < > 149*  --  116*  --  112* 112*  --   --  167*  BUN 15   < > 13  --  11  --  11 11  --   --  8  CREATININE 0.80   < > 0.60*  --  0.40*  --  0.50* 0.50*  --   --  0.75  CALCIUM 8.3*  --   --   --   --   --   --   --   --   --  6.6*  MG  --   --   --   --   --   --   --   --   --   --  3.0*   < > = values in this interval not displayed.   GFR: Estimated Creatinine Clearance: 80.4 mL/min (by C-G formula based on SCr of 0.75 mg/dL). Recent Labs  Lab 10/10/20 0700 10/10/20 1211 10/10/20 1617  WBC 6.2 10.8* 12.1*  LATICACIDVEN  --   --  1.2    Liver Function Tests: Recent Labs  Lab 10/10/20 0700  AST 33  ALT 49*  ALKPHOS 53  BILITOT 0.8  PROT 5.8*   ALBUMIN 3.5   No results for input(s): LIPASE, AMYLASE in the last 168 hours. No results for input(s): AMMONIA in the last 168 hours.  ABG    Component Value Date/Time   PHART 7.373 10/10/2020 1438   PCO2ART 34.5 10/10/2020 1438   PO2ART 104 10/10/2020 1438   HCO3 20.3 10/10/2020 1438   TCO2 21 (L) 10/10/2020 1438   ACIDBASEDEF 5.0 (H) 10/10/2020 1438   O2SAT 98.0 10/10/2020 1438     Coagulation Profile: Recent Labs  Lab 10/10/20 0700 10/10/20 1211 10/10/20 1617  INR 1.0 1.4* 1.3*    Cardiac Enzymes: No results for input(s): CKTOTAL, CKMB, CKMBINDEX, TROPONINI in the last 168 hours.  HbA1C: Hgb A1c MFr Bld  Date/Time Value Ref Range Status  10/10/2020 07:00 AM 6.2 (H) 4.8 - 5.6 % Final    Comment:    (NOTE) Pre diabetes:          5.7%-6.4%  Diabetes:              >6.4%  Glycemic control for   <7.0% adults with diabetes     CBG: Recent Labs  Lab 10/10/20 0618 10/10/20 1231 10/10/20 1355 10/10/20 1504 10/10/20 1614  GLUCAP 148* 149* 104* 152* 172*    Review of Systems:   Comatose at time of evaluation  Past Medical History:  He,  has a past medical history of Benign essential hypertension, Diabetes mellitus with renal complications (HCC), History of alcohol abuse, Hypercholesterolemia, Onychomycosis, Severe aortic stenosis, and Tobacco abuse.   Surgical History:   Past Surgical History:  Procedure Laterality Date   NO PAST SURGERIES     RIGHT HEART CATH AND CORONARY/GRAFT ANGIOGRAPHY N/A 09/01/2020   Procedure: RIGHT HEART CATH AND CORONARY/GRAFT ANGIOGRAPHY;  Surgeon: Kathleene Hazel, MD;  Location: [facility] INVASIVE CV [facility];  Service: Cardiovascular;  Laterality: N/A;     Social History:   reports that he has been smoking cigarettes. He has been smoking an average of 1 pack per day. He has never used smokeless tobacco. He reports current alcohol use of about 35.0 standard drinks per week. He reports current drug use. Drug: Marijuana.   Family  History:  His family history includes Alzheimer's disease in his mother; Colon cancer in his father; Diabetes in his father; Hypertension in his father.   Allergies Allergies  Allergen Reactions   Altace [Ramipril] Cough   Hctz [Hydrochlorothiazide]     Unknown reaction   Wellbutrin [Bupropion] Rash     Home Medications  Prior to Admission medications   Medication Sig Start Date End Date Taking? Authorizing Provider  amLODipine (NORVASC) 10 MG tablet Take 5 mg by mouth in the morning. 06/12/20  Yes [provider]  aspirin EC 81 MG tablet Take 81 mg by mouth in the morning. Swallow whole.   Yes [provider]  CINNAMON PO Take 1,000 mg by mouth in the morning.   Yes [provider]  JARDIANCE 10 MG TABS tablet Take 10 mg by mouth in the morning. 07/17/20  Yes [provider]  metFORMIN (GLUCOPHAGE) 500 MG tablet Take 500 mg by mouth 2 (two) times daily. 07/02/20  Yes [provider]  Multiple Vitamin (MULTIVITAMIN WITH MINERALS) TABS tablet Take 1 tablet by mouth in the morning.   Yes [provider]  rosuvastatin (CRESTOR) 5 MG tablet Take 5 mg by mouth at bedtime. 06/22/20  Yes [provider]  telmisartan (MICARDIS) 40 MG tablet Take 40 mg by mouth in the morning. 06/12/20  Yes [provider]  triamcinolone cream (KENALOG) 0.1 % Apply 1 application topically 2 (two) times daily as needed (red skin/irritation).   Yes [provider]  metoprolol tartrate (LOPRESSOR) 100 MG tablet Take as directed on 7/18 prior to CT scans Patient not taking: Reported on 10/02/2020 09/02/20   Kathleene Hazel, MD     Critical care time: 34 minutes

## 2020-10-10 NOTE — Progress Notes (Signed)
Patient ID: Robert Li, male   DOB: 07/22/1955, 65 y.o.   MRN: [id number] TCTS:  Pt was hemodynamically stable after returning from OR, woke up and was neurologically intact, weaned and extubated. He was reportedly sitting up in bed talking with his wife and suddenly had shaking arm movements and stopped breathing, became unresponsive with desaturation. No arrhythmia. He was bagged and intubated by anesthesia and reportedly did not get any drugs for that and initially unresponsive but by the time I arrived he was waking up some and moving all extremities and following commands. He was seen by neuro for Code Stroke as well as CCM and taken down for head and neck CTA.

## 2020-10-10 NOTE — Progress Notes (Signed)
Chaplain followed up with pt and family after emergency intubation and earlier visit by Julien Girt.  Pt is awake and appears agitated and frustrated by tube and inability to communicate.  Son supported pt in attempts to write messages regarding when tube will come out.  Pt nodded when asked about pain.  Chaplain provided emotional support to pt, son and wife and is available for ongoing support as requested.   Theodoro Parma [phone]    10/10/20 1900  Clinical Encounter Type  Visited With Patient and family together  Visit Type Follow-up;Psychological support  Referral From Chaplain  Consult/Referral To Chaplain  Stress Factors  Patient Stress Factors Health changes;Loss of control  Family Stress Factors Family relationships

## 2020-10-10 NOTE — Progress Notes (Signed)
STAT EEG complete - results pending. ? ?

## 2020-10-10 NOTE — Op Note (Signed)
CARDIOVASCULAR SURGERY OPERATIVE NOTE  10/10/2020 Robert Li [id number]  Surgeon:  [name], MD  First Assistant: Doree Fudge,  PA-C   Preoperative Diagnosis:  Severe bicuspid aortic valve stenosis   Postoperative Diagnosis:  Same   Procedure:  Median Sternotomy Extracorporeal circulation 3.   Aortic valve replacement using a 23 mm INSPIRIS RESILIA pericardial valve.  Anesthesia:  General Endotracheal   Clinical History/Surgical Indication:  This 65 year old gentleman has stage C, asymptomatic critical aortic stenosis although his family does note some exertional intolerance.  His 2D echocardiogram shows a severely calcified aortic valve with a mean gradient of 87 mmHg and a peak gradient of 126 mmHg with a valve area of 0.48 cm consistent with critical aortic stenosis.  Left ventricular systolic function is preserved with severe concentric LVH.  He has mild insignificant coronary disease on catheterization.  His gated cardiac CTA shows a severely calcified bicuspid aortic valve with fusion of left and right cusp and a calcium score of 5682.  There is moderate annular calcium adjacent to the right coronary cusp and extending into the LVOT.  I think the best option for treating this patient would be open surgical aortic valve replacement using a bioprosthetic valve.  I discussed transcatheter aortic valve replacement with him and his wife including the relative contraindication with severe bulky calcification of a bicuspid aortic valve as well as annular calcium extending into the LVOT.  I think his risk for paravalvular leak and coronary obstruction due to the bulky calcification would be increased.  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  I discussed the option of continued observation although I do not recommend it due to the risk of progressive left  ventricular deterioration and sudden death.  I reviewed the echo and cath films as well as the CT images with them and answered their questions. I discussed the operative procedure with the patient and family including alternatives, benefits and risks; including but not limited to bleeding, blood transfusion, infection, stroke, myocardial infarction, graft failure, heart block requiring a permanent pacemaker, organ dysfunction, and death.  Robert Li understands and agrees to proceed.    Preparation:  The patient was seen in the preoperative holding area and the correct patient, correct operation were confirmed with the patient after reviewing the medical record and catheterization. The consent was signed by me. Preoperative antibiotics were given. A pulmonary arterial line and radial arterial line were placed by the anesthesia team. The patient was taken back to the operating room and positioned supine on the operating room table. After being placed under general endotracheal anesthesia by the anesthesia team a foley catheter was placed. The neck, chest, abdomen, and both legs were prepped with betadine soap and solution and draped in the usual sterile manner. A surgical time-out was taken and the correct patient and operative procedure were confirmed with the nursing and anesthesia staff.   Pre-bypass TEE:   Complete TEE assessment was performed by Dr. Marguerita Merles. This showed a bicuspid aortic valve with severe calcification and thickening with minimal leaflet mobility. Mean gradient 80 mm Hg. LVEF normal with severe concentric LVH.    Post-bypass TEE:   Normal functioning prosthetic aortic valve with no perivalvular leak or regurgitation through the valve. Left ventricular function preserved. No mitral regurgitation.    Cardiopulmonary Bypass:  A median sternotomy was performed. The pericardium was opened in the midline.  Right ventricular function appeared normal. The ascending  aorta was of normal size and had calcified plaque posteriorly from the mid ascending aorta up to the innominate artery. There were no contraindications to aortic cannulation or cross-clamping. The patient was fully systemically heparinized and the ACT was maintained > 400 sec. The proximal aortic arch was cannulated with a 20 F aortic cannula for arterial inflow. Venous cannulation was performed via the right atrial appendage using a two-staged venous cannula. An antegrade cardioplegia/vent cannula was inserted into the mid-ascending aorta. A left ventricular vent was placed via the right superior pulmonary vein. A retrograde cardioplegia cannnula was placed into the coronary sinus via the right atrium. Aortic occlusion was performed with a single cross-clamp. Systemic cooling to 32 degrees Centigrade and topical cooling of the heart with iced saline were used. Cold antegrade KBC cardioplegia was used to induce diastolic arrest and then a warm reanimation dose of cardioplegia was given prior to cross clamp removal. A temperature probe was inserted into the interventricular septum and an insulating pad was placed in the pericardium. Carbon dioxide was insufflated into the pericardium at 5L/min throughout the procedure to minimize intracardiac air.   Aortic Valve Replacement:   A transverse aortotomy was performed 1 cm above the take-off of the right coronary artery. The native valve was a type 1 bicuspid with bulky calcified leaflets and heavy annular calcification. The left and right cusps were fused with a single raphe. The ostia of the coronary arteries were in normal position and were not obstructed. The native valve leaflets were excised and the annulus was decalcified with rongeurs. Care was taken to remove all particulate debris. The left ventricle was directly inspected for debris and then irrigated with ice saline solution. The annulus was sized and a size 23 mm Edwards INSPIRIS pericardial valve was  chosen. The model number was [id number] and the serial number was [id number]. While the valve was being prepared 2-0 Ethibond pledgeted horizontal mattress sutures were placed around the annulus with the pledgets in a sub-annular position. The sutures were placed through the sewing ring and the valve lowered into place. The sutures were tied sequentially using Corknot. The valve seated nicely and the coronary ostia were not obstructed. The prosthetic valve leaflets moved normally and there was no sub-valvular obstruction. The aortotomy was closed using 4-0 Prolene suture in 2 layers with felt strips to reinforce the closure.  Completion:  The patient was rewarmed to 37 degrees Centigrade. De-airing maneuvers were performed and the head placed in trendelenburg position. The crossclamp was removed with a time of 97 minutes. There was spontaneous return of sinus rhythm. The aortotomy was checked for hemostasis. Two temporary epicardial pacing wires were placed on the right atrium and two on the right ventricle. The left ventricular vent and retrograde cardioplegia cannulas were removed. The patient was weaned from CPB without difficulty on no inotropes. CPB time was 118 minutes. Cardiac output was 5 LPM. Heparin was fully reversed with protamine and the aortic and venous cannulas removed. Hemostasis was achieved. Mediastinal drainage tubes were placed. The sternum was closed with double #6 stainless steel wires. The fascia was closed with continuous # 1 vicryl suture. The subcutaneous tissue was closed with 2-0 vicryl continuous suture. The skin was closed with 3-0 vicryl subcuticular suture. All sponge, needle, and instrument counts were reported correct at the end of the case. Dry sterile dressings were placed over the incisions and around the chest tubes which were connected to pleurevac suction. The  patient was then transported to the surgical intensive care unit in stable condition.

## 2020-10-10 NOTE — Consult Note (Signed)
Neurology consult   CC: In patient code stroke.  History is obtained from: Bedside RN, chart, CVTS surgeon.   HPI: Robert Li is a 65 yo male with a PMHx of CAD, HTN, DM, HLD, tobacco abuse, ETOH abuse, LVH, bicuspid aortic valve severe stenosis s/p aortic valve replacement today with bioprosthetic valve.   After returning to ICU room post op, the patient was extubated. After which, he was his normal self, laughing, talking to RN and family at bedside. After which, around 1550, RN noted his BP to be high into the 170-180 range. The patient was becoming more and more unresponsive. His SaO2 started to drop and the patient quit breathing. He was reintubated stat. At that point, a code stroke was called and neurology team went straight to the bedside. RN did not notice obvious seizure activity or posturing.   Patient was responsive and followed commands. Nystagmus noted. Noted to be weak in the right arm. No speech secondary to ETT.   Patient was taken emergently to CT suite. CTA head and neck showed no LVO. Patient was brought back to ICU room. His exam had improved already. He was awake and alert, following commands, MOE x 4 spontaneously, no nystagmus noted, and mouthing words and shaking head correctly to questions.   The patient was not a tPA candidate due to open valve replacement today. No IR, as no LVO.   LKW: 1550  hours tpa given?: No, patient had surgery today.   IR Thrombectomy?: No, no LVO.  MRS: 0  NIHSS:  1a Level of Consciousness: 1 1b LOC Questions: 2 1c LOC Commands: 0 2 Best Gaze: 2 3 Visual: 0 4 Facial Palsy: 0 5a Motor Arm - left: 3 5b Motor Arm - Right: 4 6a Motor Leg - Left: 3 6b Motor Leg - Right: 3 7 Limb Ataxia: 0 8 Sensory: 0 9 Best Language: 3 10 Dysarthria: 1 11 Extinction and Inattention: 0 TOTAL:  22  ROS: A robust ROS was unable to be performed due to emergent nature of event.   Past Medical History:  Diagnosis Date   Benign essential  hypertension    Diabetes mellitus with renal complications (HCC)    History of alcohol abuse    Hypercholesterolemia    Onychomycosis    Severe aortic stenosis    Tobacco abuse     Family History  Problem Relation Age of Onset   Alzheimer's disease Mother    Colon cancer Father    Diabetes Father    Hypertension Father     Social History:  reports that he has been smoking cigarettes. He has been smoking an average of 1 pack per day. He has never used smokeless tobacco. He reports current alcohol use of about 35.0 standard drinks per week. He reports current drug use. Drug: Marijuana.   Prior to Admission medications   Medication Sig Start Date End Date Taking? Authorizing Provider  amLODipine (NORVASC) 10 MG tablet Take 5 mg by mouth in the morning. 06/12/20  Yes [provider]  aspirin EC 81 MG tablet Take 81 mg by mouth in the morning. Swallow whole.   Yes [provider]  CINNAMON PO Take 1,000 mg by mouth in the morning.   Yes [provider]  JARDIANCE 10 MG TABS tablet Take 10 mg by mouth in the morning. 07/17/20  Yes [provider]  metFORMIN (GLUCOPHAGE) 500 MG tablet Take 500 mg by mouth 2 (two) times daily. 07/02/20  Yes [provider]  Multiple Vitamin (MULTIVITAMIN WITH MINERALS) TABS tablet Take 1 tablet by mouth in the morning.   Yes [provider]  rosuvastatin (CRESTOR) 5 MG tablet Take 5 mg by mouth at bedtime. 06/22/20  Yes [provider]  telmisartan (MICARDIS) 40 MG tablet Take 40 mg by mouth in the morning. 06/12/20  Yes [provider]  triamcinolone cream (KENALOG) 0.1 % Apply 1 application topically 2 (two) times daily as needed (red skin/irritation).   Yes [provider]  metoprolol tartrate (LOPRESSOR) 100 MG tablet Take as directed on 7/18 prior to CT scans Patient not taking: Reported on 10/02/2020 09/02/20   Kathleene Hazel, MD    Exam: Current vital signs: BP 90/61    Pulse 80   Temp 97.7 F (36.5 C)   Resp 16   Ht 5\' 6"  (1.676 m)   Wt 62.6 kg   SpO2 100%   BMI 22.27 kg/m   Physical Exam  Constitutional: Acutely ill appearing male lying in ICU bed attending by multiple staff.  Psych: Affect appropriate to situation. Eyes: No scleral injection. HENT: ETT in place. Head: Normocephalic.  Cardiovascular: Normal rate and regular rhythm. Paced.  Respiratory: On ventilator.  GI: Abdomen soft.  No distension. There is no tenderness.  Skin: WDI. Dressing intact to chest.   Neuro: Mental Status: Patient is awake.  He readily follows commands to wiggle toes and thumbs, as well as stick out tongue.  No signs of neglect. Speech/Language:  Follows commands.  Mouths words around ET tube.  Cranial Nerves: II:  Pupils are equal, round, and reactive to light.  III,IV, VI: No forced gaze palsy, able to cross midline in both directions. EOMI without ptosis or diploplia. + nystagmus on 1st exam.  V,VII: Facial movement grossly symmetrical with ETT.  VIII: hearing is intact to voice. IX, X: cough not tested.  XI: head midline.  XII: tongue midline  Motor: Intially unable to lift extremities off of the bed, even with coaching. Did move his right arm slightly in the CT scanner.  Sensory: Withdraws to noxious stimuli.  Plantars: Toes are downgoing bilaterally.  Cerebellar: No tremors, twitching or jerking noted.    Exam change after back in ICU:  More alert, mouthing words, Shaking and nodding head appropriately, moving all extremities spontaneously and on command. Can lift extremities off bed. Effort against gravity with strength exam in all 4 extremities.   MD reviewed the images obtained:  NCT head/CTA head and neck:   No acute intracranial hemorrhage or evidence of acute infarction. Age-indeterminate small vessel infarcts of the central gray nuclei. Chronic microvascular ischemic changes.  No large vessel occlusion, hemodynamically  significant stenosis, or evidence of dissection.   Patient seen by , MSN, APN-BC, nurse practitioner and by MD. Note/plan to be edited by MD as needed.  Pager: [phone]  I have seen the patient and reviewed the above note.   Assessment: 65 yo male who underwent aortic valve replacement today for severe AS who became unresponsive with unclear etiology. Not a candidate for tPA due recent surgery, and not a thrombectomy candidate due to no LVO on CTA. Unlikely to be vagal/hypoperfusion event as he became unresponsive before any hypotension or any changes to his oximetry. Transient posterior circulation embolic ischemia would be another consideration in the post operative setting. Seizure would be another consideration.    Plan: - MRI brain without contrast when able.  - Aspirin 325mg  daily already ordered per vascular surgeon.  -  PT/OT/SLP consult when off bedrest.  - NIHSS as per protocol. - frequent neuro checks.  - rEEG to eval for any epileptiform discharges.  Robert Slot, MD [facility] [phone]  If 7pm- 7am, please page neurology on call as listed in [facility].

## 2020-10-10 NOTE — Hospital Course (Addendum)
HPI: The patient is a 65 year old gentleman with history of hypertension, diabetes, hypercholesterolemia, heavy smoking, alcohol abuse, and bicuspid aortic valve stenosis recently diagnosed on a lung cancer screening CT which showed heavy aortic valve calcification.  2D echocardiogram on 08/15/2020 showed a heavily calcified aortic valve with a mean gradient of 87 mmHg and a peak gradient of 126 mmHg.  Aortic valve area by VTI was 0.48 cm.  Left ventricular ejection fraction was 60 to 65% with severe LVH.  Cardiac catheterization on 09/01/2020 showed mild nonobstructive coronary disease in the proximal RCA and LAD.  Right heart pressures were normal.   He denies any symptoms of exertional shortness of breath or chest discomfort.  He denies fatigue.  He has had no dizziness or syncope.  He does report when he push mows his grass that he develops left calf tightness after a couple trips around his yard.  This resolves quickly with rest.  His wife reports that he frequently takes naps later in the afternoon.  He is not here His son has noticed some exercise intolerance.  Per Dr. Laneta Simmers, the best option for treating this patient would be open surgical aortic valve replacement using a bioprosthetic valve.  Dr. Laneta Simmers discussed transcatheter aortic valve replacement with him and his wife including the relative contraindication with severe bulky calcification of a bicuspid aortic valve as well as annular calcium extending into the LVOT.  Dr. Laneta Simmers thought his risk for paravalvular leak and coronary obstruction due to the bulky calcification would be increased.  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  Dr. Laneta Simmers discussed the option of continued observation although Dr. Laneta Simmers not recommend it due to the risk of progressive left ventricular deterioration and sudden death. Potential risks, benefits, and  complications of undergoing a bioprosthetic aortic valve replacement were discussed and patient agreed to proceed with surgery. He underwent an AVR on 10/10/2020.  Hospital Course: Patient was transferred from the OR to 4E in stable condition. He was extubated without difficulty the afternoon of surgery. Per Dr. Sharee Pimple note, patient reportedly was sitting up in bed talking with his wife and suddenly had shaking arm movements and stopped breathing. He became unresponsive with desaturation. No arrhythmia. He was bagged and intubated by anesthesia and reportedly did not get any drugs for that and initially unresponsive but by the time Dr. Laneta Simmers arrived, patient was waking up some and moving all extremities and following commands. He was seen by neuro for Code Stroke as well as CCM and taken down for head and neck CTA. Dr. Laneta Simmers reviewed CTA with neurologist. There is no sign of major vessel occlusion in neck or intracranial. Aortic arch looks fine with no sign of aortic dissection. He is hemodynamically stable in paced rhythm. The plan was to keep patient sedated on vent overnight and plan to wean to extubate in am. This was in fact what occurred. Theone Murdoch, a line, chest tubes, and foley were removed early in his post operative course. He remained neurologically intact. He was started on Lopressor and remained in sinus rhythm. He was volume overloaded and diuresed accordingly. He had expected post op blood loss anemia. He did not require a post op transfusion. His H and H on 08/23 was **. He also had thrombocytopenia. His platelets decreased to 77,000 but gradually increased. Last platelet count was up to 86,000. He was weaned off the Insulin drip. His  pre op HGA1C was 6.2.  Metformin and Jardiance will be restarted He was felt surgically stable for transfer on 08/22 but a bed was not available. He has been ambulating on room air. His sternal wound is clean, dry, and healing without sign of infection. He has  been tolerating a diet and has had a bowel movement. Epicardial pacing wires were removed on 08/23. Chest tube sutures will be removed in the office after discharge.

## 2020-10-10 NOTE — Discharge Instructions (Signed)

## 2020-10-10 NOTE — Anesthesia Postprocedure Evaluation (Signed)
Anesthesia Post Note  Patient: Robert Li  Procedure(s) Performed: AORTIC VALVE REPLACEMENT (AVR) WITH INSPIRIS AORTIC VALVE (Chest) TRANSESOPHAGEAL ECHOCARDIOGRAM (TEE) APPLICATION OF CELL SAVER     Patient location during evaluation: SICU Anesthesia Type: General Level of consciousness: sedated Pain management: pain level controlled Vital Signs Assessment: post-procedure vital signs reviewed and stable Respiratory status: patient remains intubated per anesthesia plan Cardiovascular status: stable Postop Assessment: no apparent nausea or vomiting Anesthetic complications: no   No notable events documented.  Last Vitals:  Vitals:   10/10/20 1400 10/10/20 1401  BP: 99/74 111/65  Pulse: 80 80  Resp: 19 14  Temp: (!) 35.8 C   SpO2: 100% 100%    Last Pain:  Vitals:   10/10/20 1230  TempSrc: Core  PainSc:                  Robert Li,W. EDMOND

## 2020-10-10 NOTE — Transfer of Care (Signed)
Immediate Anesthesia Transfer of Care Note  Patient: Robert Li  Procedure(s) Performed: AORTIC VALVE REPLACEMENT (AVR) WITH INSPIRIS AORTIC VALVE (Chest) TRANSESOPHAGEAL ECHOCARDIOGRAM (TEE) APPLICATION OF CELL SAVER  Patient Location: SICU  Anesthesia Type:General  Level of Consciousness: Patient remains intubated per anesthesia plan  Airway & Oxygen Therapy: Patient remains intubated per anesthesia plan and Patient placed on Ventilator (see vital sign flow sheet for setting)  Post-op Assessment: Report given to RN and Post -op Vital signs reviewed and stable  Post vital signs: Reviewed and stable  Last Vitals:  Vitals Value Taken Time  BP 124/85 10/10/20 1221  Temp 36.2 C 10/10/20 1225  Pulse 80 10/10/20 1225  Resp 15 10/10/20 1225  SpO2 98 % 10/10/20 1225  Vitals shown include unvalidated device data.  Last Pain:  Vitals:   10/10/20 0620  TempSrc:   PainSc: 0-No pain         Complications: No notable events documented.

## 2020-10-10 NOTE — Discharge Summary (Signed)
Physician Discharge Summary       [address]       [place] [id number]             [phone]    Patient ID: Robert Li MRN: [id number] DOB/AGE: [birth year] 67 y.o.  Admit date: 10/10/2020 Discharge date: 10/15/2020  Admission Diagnoses: Critical aortic stenosis Discharge Diagnoses:  S/p bioprosthetic AVR Expected post op blood loss anemia History of Benign essential hypertension History of alcohol abuse History of hypercholesterolemia History of tobacco abuse History of Onychomycosis  Consults: None  Procedure (s):  Median Sternotomy Extracorporeal circulation 3.   Aortic valve replacement using a 23 mm INSPIRIS RESILIA pericardial valve by Dr. Laneta SimmersBartle on 10/10/2020.  History of Presenting Illness: The patient is a 65 year old gentleman with history of hypertension, diabetes, hypercholesterolemia, heavy smoking, alcohol abuse, and bicuspid aortic valve stenosis recently diagnosed on a lung cancer screening CT which showed heavy aortic valve calcification.  2D echocardiogram on 08/15/2020 showed a heavily calcified aortic valve with a mean gradient of 87 mmHg and a peak gradient of 126 mmHg.  Aortic valve area by VTI was 0.48 cm.  Left ventricular ejection fraction was 60 to 65% with severe LVH.  Cardiac catheterization on 09/01/2020 showed mild nonobstructive coronary disease in the proximal RCA and LAD.  Right heart pressures were normal.   He denies any symptoms of exertional shortness of breath or chest discomfort.  He denies fatigue.  He has had no dizziness or syncope.  He does report when he push mows his grass that he develops left calf tightness after a couple trips around his yard.  This resolves quickly with rest.  His wife reports that he frequently takes naps later in the afternoon.  He is not here His son has noticed some exercise intolerance.  Per Dr. Laneta SimmersBartle, the best option for treating this patient would be open surgical aortic valve  replacement using a bioprosthetic valve.  Dr. Laneta SimmersBartle discussed transcatheter aortic valve replacement with him and his wife including the relative contraindication with severe bulky calcification of a bicuspid aortic valve as well as annular calcium extending into the LVOT.  Dr. Laneta SimmersBartle thought his risk for paravalvular leak and coronary obstruction due to the bulky calcification would be increased.  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  Dr. Laneta SimmersBartle discussed the option of continued observation although Dr. Laneta SimmersBartle not recommend it due to the risk of progressive left ventricular deterioration and sudden death. Potential risks, benefits, and complications of undergoing a bioprosthetic aortic valve replacement were discussed and patient agreed to proceed with surgery. He underwent an AVR on 10/10/2020.  Brief Hospital Course:  atient was transferred from the OR to 4E in stable condition. He was extubated without difficulty the afternoon of surgery. Per Dr. Sharee PimpleBartle's note, patient reportedly was sitting up in bed talking with his wife and suddenly had shaking arm movements and stopped breathing. He became unresponsive with desaturation. No arrhythmia. He was bagged and intubated by anesthesia and reportedly did not get any drugs for that and initially unresponsive but by the time Dr. Laneta SimmersBartle arrived, patient was waking up some and moving all extremities and following commands. He was seen by neuro for Code Stroke as well as CCM and taken down for head and neck CTA. Dr. Laneta SimmersBartle reviewed CTA with neurologist. There is no sign of major vessel occlusion in neck or intracranial.  Aortic arch looks fine with no sign of aortic dissection. He is hemodynamically stable in paced rhythm. The plan was to keep patient sedated on vent overnight and plan to wean to extubate in am. This was in fact what occurred. Theone Murdoch, a line, chest tubes, and foley were removed early in his post operative course. He remained neurologically intact. He was started on Lopressor and remained in sinus rhythm. He was volume overloaded and diuresed accordingly. He had expected post op blood loss anemia. He did not require a post op transfusion. His H and H on 08/22 was stable at 11.7 and 35.6. He also had thrombocytopenia. His platelets decreased to 77,000 but gradually increased. Last platelet count was up to 86,000. He was weaned off the Insulin drip. His pre op HGA1C was 6.2.  Metformin and Jardiance will be restarted He was felt surgically stable for transfer on 08/22 but a bed was not available. He has been ambulating on room air. His sternal wound is clean, dry, and healing without sign of infection. He has been tolerating a diet and has had a bowel movement. Per Dr. Laneta Simmers, patient is at his baseline weight so diuretic will not be prescribed at discharge. Epicardial pacing wires were removed on 08/23. Chest tube sutures will be removed in the office after discharge. Per Dr. Laneta Simmers, patient is stable for discharge today.  Latest Vital Signs: Blood pressure 138/82, pulse 87, temperature (!) 97.5 F (36.4 C), temperature source Oral, resp. rate 13, height 5\' 6"  (1.676 m), weight 62.8 kg, SpO2 97 %.  Physical Exam: General appearance: alert and cooperative Neurologic: intact Heart: regular rate and rhythm Lungs: clear to auscultation bilaterally Extremities: extremities normal, atraumatic, no cyanosis or edema Wound: incision ok   Discharge Condition:Stable and discharged to home.  Recent laboratory studies:  Lab Results  Component Value Date   WBC 9.5 10/13/2020   HGB 11.7 (L) 10/13/2020   HCT 35.6 (L) 10/13/2020   MCV 98.9 10/13/2020   PLT 86 (L) 10/13/2020   Lab Results  Component Value Date   NA 134 (L) 10/13/2020   K 4.3 10/13/2020   CL 103 10/13/2020   CO2 21 (L) 10/13/2020   CREATININE 0.86 10/13/2020    GLUCOSE 112 (H) 10/13/2020      Diagnostic Studies: DG Chest 2 View  Result Date: 10/13/2020 CLINICAL DATA:  65 year old male with a history aortic valve replacement EXAM: CHEST - 2 VIEW COMPARISON:  10/12/2020 FINDINGS: Cardiomediastinal silhouette unchanged in size and contour. Surgical changes of median sternotomy and aortic valve repair. Epicardial pacing leads remain in place. Interval removal of the right IJ sheath. Small bilateral pleural effusions with streaky opacities at the lung bases on the lateral view. No pneumothorax. No new confluent airspace disease. No displaced fracture IMPRESSION: Trace bilateral pleural effusions with associated atelectasis. Surgical changes of median sternotomy and aortic valve repair. Interval removal of the right IJ sheath. Electronically Signed   By: 10/14/2020 D.O.   On: 10/13/2020 08:30   DG Chest 2 View  Result Date: 10/08/2020 CLINICAL DATA:  Preop aortic valve stenosis EXAM: CHEST - 2 VIEW COMPARISON:  CT 09/08/2020 FINDINGS: The heart size and mediastinal contours are within normal limits. Both lungs are clear. The visualized skeletal structures are unremarkable. IMPRESSION: No active cardiopulmonary disease. Electronically Signed   By: 09/10/2020 M.D.   On: 10/08/2020 22:40   DG Chest Port 1 View  Result Date: 10/12/2020 CLINICAL DATA:  Status post aortic valve repair  EXAM: PORTABLE CHEST 1 VIEW COMPARISON:  1 day prior FINDINGS: Removal of support apparatus with a right IJ Cordis sheath remaining. Prior median sternotomy. Borderline cardiomegaly. No pleural effusion or pneumothorax. No congestive failure. Slight increase in left and developing right base airspace disease medially. IMPRESSION: Removal of support apparatus, including endotracheal tube. Slight increase in left and developing right base airspace disease, favoring compressive atelectasis. At the left lung base, infection is a secondary consideration. Electronically Signed   By: Jeronimo Greaves M.D.   On: 10/12/2020 11:20   DG Chest Port 1 View  Result Date: 10/11/2020 CLINICAL DATA:  Pneumothorax follow-up EXAM: PORTABLE CHEST 1 VIEW COMPARISON:  Yesterday FINDINGS: Endotracheal tube with tip 13 mm above the carina. Chest drains in place. Swan-Ganz catheter from the right with tip at the main pulmonary artery. Presumed atelectasis behind the heart. Stable heart size after aortic valve replacement. No visible pneumothorax. IMPRESSION: Stable hardware positioning and retrocardiac atelectasis. Electronically Signed   By: Marnee Spring M.D.   On: 10/11/2020 07:29   DG CHEST PORT 1 VIEW  Result Date: 10/10/2020 CLINICAL DATA:  Post intubation. EXAM: PORTABLE CHEST 1 VIEW COMPARISON:  October 10, 2020. FINDINGS: EKG leads project over the chest.i Endotracheal tube approximately 1.5 cm from the carina, could consider retraction approximately 2-2.5 cm for more optimal placement. Median sternotomy and signs of aortic valve replacement as before. Swan-Ganz catheter in the area of the main pulmonary artery entering via RIGHT IJ approach. Cardiomediastinal contours and hilar structures are stable. Chest support tubes projecting over the mediastinum and lower LEFT chest are unchanged. Interval removal of gastric tube with mild gaseous distension of the stomach. No lobar consolidation. No visible pneumothorax. Mild LEFT lower lobe retrocardiac density as developed since the prior study. Platelike areas of airspace disease in the RIGHT chest. Defibrillator pads project over the patient's LEFT chest. On limited assessment no acute skeletal process. IMPRESSION: 1. Endotracheal tube approximately 1.5 cm from the carina. Could consider slight retraction of this tube is described above. 2. Developing density at the LEFT lung base and areas of atelectasis in the RIGHT chest as described. Attention on follow-up, correlate with any signs that would suggest developing infection. 3. Interval removal of gastric  tube with moderate gaseous distension of the stomach. These results will be called to the ordering clinician or representative by the Radiologist Assistant, and communication documented in the PACS or [employer]. Electronically Signed   By: Donzetta Kohut M.D.   On: 10/10/2020 17:53   DG Chest Port 1 View  Result Date: 10/10/2020 CLINICAL DATA:  Postop EXAM: PORTABLE CHEST 1 VIEW COMPARISON:  10/08/2020 FINDINGS: Interval placement of pulmonary artery catheter, with tip overlying the expected position of the main pulmonary artery, and gastric tube, the side port of which overlies stomach. Status post interval median sternotomy and aortic valve replacement. Unchanged cardiac and mediastinal contours when accounting for differences in technique. No focal pulmonary opacity. No pleural effusion. No acute osseous abnormality. IMPRESSION: 1. Interval placement of pulmonary artery catheter and gastric tube, which appear in appropriate position. 2. No acute process in the chest. Electronically Signed   By: Wiliam Ke M.D.   On: 10/10/2020 13:01   EEG adult  Result Date: 10/10/2020 Charlsie Quest, MD     10/11/2020  5:04 AM Patient Name: Robert Li MRN: [id number] Epilepsy Attending: Charlsie Quest Referring Physician/Provider: Jimmye Norman, NP Date: 10/10/2020 Duration: 27.16 mins Patient history: 65 yo male who underwent aortic valve  replacement today for severe AS who became unresponsive with unclear etiology. EEG to evaluate for seizure Level of alertness: asleep/sedated AEDs during EEG study: None Technical aspects: This EEG study was done with scalp electrodes positioned according to the 10-20 International system of electrode placement. Electrical activity was acquired at a sampling rate of 500Hz  and reviewed with a high frequency filter of 70Hz  and a low frequency filter of 1Hz . EEG data were recorded continuously and digitally stored. Description: EEG showed continuous generalized 3  to 6 Hz theta-delta slowing admixed with 15 to 18 Hz beta activity distributed symmetrically and diffusely. Hyperventilation and photic stimulation were not performed.   ABNORMALITY - Continuous slow, generalized IMPRESSION: This study is suggestive of moderate to severe diffuse encephalopathy, nonspecific etiology but likely related to sedation. No seizures or epileptiform discharges were seen throughout the recording. Priyanka Annabelle Harman   ECHO INTRAOPERATIVE TEE  Result Date: 10/10/2020  *INTRAOPERATIVE TRANSESOPHAGEAL REPORT *  Patient Name:   LEHMAN WHITELEY Date of Exam: 10/10/2020 Medical Rec #:  [id number]         Height:       66.0 in Accession #:    [id number]        Weight:       138.0 lb Date of Birth:  [birth year]        BSA:          1.71 m Patient Age:    64 years          BP:           129/73 mmHg Patient Gender: M                 HR:           87 bpm. Exam Location:  Anesthesiology Transesophogeal exam was perform intraoperatively during surgical procedure. Patient was closely monitored under general anesthesia during the entirety of examination. Indications:     Aortic Stenosis i35.0 Sonographer:     Irving Burton Senior RDCS Performing Phys: Gaynelle Adu MD Diagnosing Phys: Gaynelle Adu MD Complications: No known complications during this procedure. POST-OP IMPRESSIONS _ Left Ventricle: The left ventricle is unchanged from pre-bypass. _ Right Ventricle: The right ventricle appears unchanged from pre-bypass. _ Aortic Valve: A bioprosthetic bioprosthetic valve was placed Size; 23mm. No regurgitation post repair. The gradient recorded across the prosthetic valve is within the expected range. No perivalvular leak noted. _ Mitral Valve: The mitral valve appears unchanged from pre-bypass. _ Tricuspid Valve: The tricuspid valve appears unchanged from pre-bypass. _ Pulmonic Valve: The pulmonic valve appears unchanged from pre-bypass. PRE-OP FINDINGS  Left Ventricle: The left ventricle has normal  systolic function, with an ejection fraction of 60-65%. The cavity size was normal. There is severely increased left ventricular wall thickness. There is severe concentric left ventricular hypertrophy. Right Ventricle: The right ventricle has normal systolic function. The cavity was not assessed. There is no increase in right ventricular wall thickness. Left Atrium: Left atrial size was not assessed. No left atrial/left atrial appendage thrombus was detected. Right Atrium: Right atrial size was not assessed. Interatrial Septum: No atrial level shunt detected by color flow Doppler. Pericardium: There is no evidence of pericardial effusion. Mitral Valve: The mitral valve is normal in structure. Mitral valve regurgitation is trivial by color flow Doppler. There is moderate thickening present on the mitral valve anterior cusp with normal mobility. Tricuspid Valve: The tricuspid valve was normal in structure. Tricuspid valve regurgitation was not visualized by color flow Doppler. Aortic  Valve: Abnormal Aortic valve regurgitation was not visualized by color flow Doppler. There is severe stenosis of the aortic valve, with a calculated valve area of 0.99 cm. Pulmonic Valve: The pulmonic valve was normal in structure. Pulmonic valve regurgitation is not visualized by color flow Doppler. +--------------+--------++ LEFT VENTRICLE         +--------------+--------++ PLAX 2D                +--------------+--------++ LVOT diam:    2.20 cm  +--------------+--------++ LVOT Area:    3.80 cm +--------------+--------++                        +--------------+--------++ +------------------+------------++ AORTIC VALVE                   +------------------+------------++ AV Area (Vmax):   0.97 cm     +------------------+------------++ AV Area (Vmean):  1.01 cm     +------------------+------------++ AV Area (VTI):    0.99 cm     +------------------+------------++ AV Vmax:          364.50 cm/s   +------------------+------------++ AV Vmean:         273.000 cm/s +------------------+------------++ AV VTI:           0.874 m      +------------------+------------++ AV Peak Grad:     53.1 mmHg    +------------------+------------++ AV Mean Grad:     43.5 mmHg    +------------------+------------++ LVOT Vmax:        93.00 cm/s   +------------------+------------++ LVOT Vmean:       72.700 cm/s  +------------------+------------++ LVOT VTI:         0.228 m      +------------------+------------++ LVOT/AV VTI ratio:0.26         +------------------+------------++  +--------------+-------+ SHUNTS                +--------------+-------+ Systemic VTI: 0.23 m  +--------------+-------+ Systemic Diam:2.20 cm +--------------+-------+  Gaynelle Adu MD Electronically signed by Gaynelle Adu MD Signature Date/Time: 10/10/2020/12:40:35 PM    Final    VAS US DOPPLER PRE CABG  Result Date: 10/08/2020 PREOPERATIVE VASCULAR EVALUATION Patient Name:  JONATHYN CAROTHERS  Date of Exam:   10/08/2020 Medical Rec #: [id number]          Accession #:    [id number] Date of Birth: May 03, 1955         Patient Gender: M Patient Age:   68 years Exam Location:  [facility] Procedure:      VAS US DOPPLER PRE CABG Referring Phys: Evelene Croon --------------------------------------------------------------------------------  Risk Factors:     Hypertension, Diabetes, current smoker, coronary artery                   disease. Comparison Study: Carotid 09/08/20 - 1-39% bilaterally Performing Technologist: [place] Sink Sturdivant RDMS, RVT  Examination Guidelines: A complete evaluation includes B-mode imaging, spectral Doppler, color Doppler, and power Doppler as needed of all accessible portions of each vessel. Bilateral testing is considered an integral part of a complete examination. Limited examinations for reoccurring indications may be performed as noted.  ABI Findings:  +--------+------------------+-----+---------+--------+ Right   Rt Pressure (mmHg)IndexWaveform Comment  +--------+------------------+-----+---------+--------+ [id number]                    triphasic         +--------+------------------+-----+---------+--------+ +--------+------------------+-----+---------+-------+ Left    Lt Pressure (mmHg)IndexWaveform Comment +--------+------------------+-----+---------+-------+ [id number]  triphasic        +--------+------------------+-----+---------+-------+  Right Doppler Findings: +-----------+--------+-----+---------+--------+ Site       PressureIndexDoppler  Comments +-----------+--------+-----+---------+--------+ Brachial   131          triphasic         +-----------+--------+-----+---------+--------+ Radial                  triphasic         +-----------+--------+-----+---------+--------+ Ulnar                   triphasic         +-----------+--------+-----+---------+--------+ Palmar Arch                      WNL      +-----------+--------+-----+---------+--------+  Left Doppler Findings: +-----------+--------+-----+---------+--------+ Site       PressureIndexDoppler  Comments +-----------+--------+-----+---------+--------+ Brachial   137          triphasic         +-----------+--------+-----+---------+--------+ Radial                  triphasic         +-----------+--------+-----+---------+--------+ Ulnar                   triphasic         +-----------+--------+-----+---------+--------+ Palmar Arch                      WNL      +-----------+--------+-----+---------+--------+  Summary:  Left Carotid: Carotid done on 09/08/20 showed bilateral miminal plaque with 1-39%               stenosis. Bilateral Extremity: Doppler waveforms remain within normal limits with compression bilaterally for the radial arteries. Doppler waveforms remain within normal limits with compression  bilaterally for the ulnar arteries.  Electronically signed by Coral Else MD on 10/08/2020 at 10:38:36 PM.    Final    CT HEAD CODE STROKE WO CONTRAST  Result Date: 10/10/2020 CLINICAL DATA:  Neuro deficit, acute, stroke suspected EXAM: CT ANGIOGRAPHY HEAD AND NECK TECHNIQUE: Multidetector CT imaging of the head and neck was performed using the standard protocol during bolus administration of intravenous contrast. Multiplanar CT image reconstructions and MIPs were obtained to evaluate the vascular anatomy. Carotid stenosis measurements (when applicable) are obtained utilizing NASCET criteria, using the distal internal carotid diameter as the denominator. COMPARISON:  None. FINDINGS: CT HEAD Brain: There is no acute intracranial hemorrhage, mass effect, or edema. Gray-white differentiation is preserved. There is no extra-axial fluid collection. Patchy low-attenuation in the supratentorial white matter is nonspecific but may reflect mild to moderate chronic microvascular ischemic changes. There are age-indeterminate small vessel infarcts of the central gray nuclei bilaterally. Vascular: No hyperdense vessel. Skull: Calvarium is unremarkable. Sinuses/Orbits: Paranasal sinus mucosal thickening. Right maxillary sinus retention cyst or polyp. Orbits are unremarkable. Other: -L2 L ASPECTS ([place] Stroke Program Early CT Score) - Ganglionic level infarction (caudate, lentiform nuclei, internal capsule, insula, M1-M3 cortex): 7 - Supraganglionic infarction (M4-M6 cortex): 3 Total score (0-10 with 10 being normal): 10 CTA NECK Aortic arch: Mixed plaque along the included arch. Great vessel origins are patent. No high-grade proximal subclavian artery stenosis. Right carotid system: Patent. Calcified plaque along the proximal internal carotid causing minimal stenosis. Left carotid system: Patent. Mixed plaque along the proximal internal carotid causing less than 50% stenosis. Vertebral arteries: Patent. Left vertebral  artery is dominant. There is multifocal extrinsic compression from  osteophytes with moderate stenosis. Plaque is present at the right vertebral origin with at least mild stenosis. Skeleton: Partially imaged sternotomy. Degenerative changes of the cervical spine. Other neck: Left lower neck subcutaneous edema. Retained secretions in the pharynx. Upper chest: Postoperative changes with foci of air in the chest wall and mediastinum. Endotracheal tube is partially imaged. Review of the MIP images confirms the above findings CTA HEAD Anterior circulation: Intracranial internal carotid arteries are patent with primarily calcified plaque causing mild stenosis. Anterior cerebral arteries are patent with anterior communicating artery present. Middle cerebral arteries are patent. Posterior circulation: Intracranial vertebral arteries are patent. Mild calcified plaque is present. Basilar artery is patent. Major cerebellar artery origins are patent. Posterior cerebral arteries are patent. Venous sinuses: Patent as allowed by contrast bolus timing. Review of the MIP images confirms the above findings IMPRESSION: No acute intracranial hemorrhage or evidence of acute infarction. Age-indeterminate small vessel infarcts of the central gray nuclei. Chronic microvascular ischemic changes. No large vessel occlusion, hemodynamically significant stenosis, or evidence of dissection. These results were communicated to Dr. Amada Jupiter at 5:08 pm on 10/10/2020 by text page via the [facility] messaging system. Electronically Signed   By: Guadlupe Spanish M.D.   On: 10/10/2020 17:16   CT ANGIO HEAD NECK W WO CM (CODE STROKE)  Result Date: 10/10/2020 CLINICAL DATA:  Neuro deficit, acute, stroke suspected EXAM: CT ANGIOGRAPHY HEAD AND NECK TECHNIQUE: Multidetector CT imaging of the head and neck was performed using the standard protocol during bolus administration of intravenous contrast. Multiplanar CT image reconstructions and MIPs were obtained  to evaluate the vascular anatomy. Carotid stenosis measurements (when applicable) are obtained utilizing NASCET criteria, using the distal internal carotid diameter as the denominator. COMPARISON:  None. FINDINGS: CT HEAD Brain: There is no acute intracranial hemorrhage, mass effect, or edema. Gray-white differentiation is preserved. There is no extra-axial fluid collection. Patchy low-attenuation in the supratentorial white matter is nonspecific but may reflect mild to moderate chronic microvascular ischemic changes. There are age-indeterminate small vessel infarcts of the central gray nuclei bilaterally. Vascular: No hyperdense vessel. Skull: Calvarium is unremarkable. Sinuses/Orbits: Paranasal sinus mucosal thickening. Right maxillary sinus retention cyst or polyp. Orbits are unremarkable. Other: -L2 L ASPECTS ([place]berta Stroke Program Early CT Score) - Ganglionic level infarction (caudate, lentiform nuclei, internal capsule, insula, M1-M3 cortex): 7 - Supraganglionic infarction (M4-M6 cortex): 3 Total score (0-10 with 10 being normal): 10 CTA NECK Aortic arch: Mixed plaque along the included arch. Great vessel origins are patent. No high-grade proximal subclavian artery stenosis. Right carotid system: Patent. Calcified plaque along the proximal internal carotid causing minimal stenosis. Left carotid system: Patent. Mixed plaque along the proximal internal carotid causing less than 50% stenosis. Vertebral arteries: Patent. Left vertebral artery is dominant. There is multifocal extrinsic compression from osteophytes with moderate stenosis. Plaque is present at the right vertebral origin with at least mild stenosis. Skeleton: Partially imaged sternotomy. Degenerative changes of the cervical spine. Other neck: Left lower neck subcutaneous edema. Retained secretions in the pharynx. Upper chest: Postoperative changes with foci of air in the chest wall and mediastinum. Endotracheal tube is partially imaged. Review of the  MIP images confirms the above findings CTA HEAD Anterior circulation: Intracranial internal carotid arteries are patent with primarily calcified plaque causing mild stenosis. Anterior cerebral arteries are patent with anterior communicating artery present. Middle cerebral arteries are patent. Posterior circulation: Intracranial vertebral arteries are patent. Mild calcified plaque is present. Basilar artery is patent. Major cerebellar artery origins are patent.  Posterior cerebral arteries are patent. Venous sinuses: Patent as allowed by contrast bolus timing. Review of the MIP images confirms the above findings IMPRESSION: No acute intracranial hemorrhage or evidence of acute infarction. Age-indeterminate small vessel infarcts of the central gray nuclei. Chronic microvascular ischemic changes. No large vessel occlusion, hemodynamically significant stenosis, or evidence of dissection. These results were communicated to Dr. Amada Jupiter at 5:08 pm on 10/10/2020 by text page via the [facility] messaging system. Electronically Signed   By: Guadlupe Spanish M.D.   On: 10/10/2020 17:16     Discharge Medications: Allergies as of 10/15/2020       Reactions   Altace [ramipril] Cough   Hctz [hydrochlorothiazide]    Unknown reaction   Wellbutrin [bupropion] Rash        Medication List     STOP taking these medications    amLODipine 10 MG tablet Commonly known as: NORVASC   telmisartan 40 MG tablet Commonly known as: MICARDIS       TAKE these medications    aspirin 325 MG EC tablet Take 1 tablet (325 mg total) by mouth daily. Start taking on: October 16, 2020 What changed:  medication strength how much to take when to take this additional instructions   CINNAMON PO Take 1,000 mg by mouth in the morning.   Jardiance 10 MG Tabs tablet Generic drug: empagliflozin Take 10 mg by mouth in the morning.   metFORMIN 500 MG tablet Commonly known as: GLUCOPHAGE Take 500 mg by mouth 2 (two) times  daily.   metoprolol tartrate 25 MG tablet Commonly known as: LOPRESSOR Take 1 tablet (25 mg total) by mouth 2 (two) times daily. What changed:  medication strength how much to take how to take this when to take this additional instructions   multivitamin with minerals Tabs tablet Take 1 tablet by mouth in the morning.   oxyCODONE 5 MG immediate release tablet Commonly known as: Oxy IR/ROXICODONE Take 1 tablet (5 mg total) by mouth every 6 (six) hours as needed for severe pain.   rosuvastatin 5 MG tablet Commonly known as: CRESTOR Take 5 mg by mouth at bedtime.   triamcinolone cream 0.1 % Commonly known as: KENALOG Apply 1 application topically 2 (two) times daily as needed (red skin/irritation).       The patient has been discharged on:   1.Beta Blocker:  Yes [ x  ]                              No   [   ]                              If No, reason:  2.Ace Inhibitor/ARB: Yes [   ]                                     No  [   x ]                                     If No, reason:Labile BP. Will consider restarting Micardis as an outpatient after discharge when BP allows  3.Statin:   Yes [ x  ]  No  [   ]                  If No, reason:  4.Ecasa:  Yes  [   x]                  No   [   ]                  If No, reason:  Patient had ACS upon admission:NO  Plavix/P2Y12 inhibitor: Yes [   ]                                      No  [  x ]   Follow Up Appointments:  Follow-up Information     [name], MD Follow up on 11/12/2020.   Specialty: Cardiothoracic Surgery Why: PA/LAT CXR to be taken (at [facility] which is in the same building as Dr. Sharee Pimple office) on 09/21 at 9:00 am;Appointment time is at 9:30 am Contact information: [address] [place] [address] [phone]         Baldo Daub, MD. Go on 10/30/2020.   Specialties: Cardiology, Radiology Why: Appointment time is at 2:00 pm Contact  information: [address] [place] [address] [phone]         [facility] [place]. Go on 10/24/2020.   Specialty: Cardiothoracic Surgery Why: Appointment is with nurse only for chest tube suture removal. Appointment time is at 11:30 am Contact information: [address] [place], [address] [place] [address] [phone]                Signed: Lelon Huh [facility] 10/15/2020, 8:22 AM

## 2020-10-10 NOTE — Anesthesia Procedure Notes (Signed)
Procedure Name: Intubation Date/Time: 10/10/2020 7:53 AM Performed by: Macie Burows, CRNA Pre-anesthesia Checklist: Patient identified, Emergency Drugs available, Suction available and Patient being monitored Patient Re-evaluated:Patient Re-evaluated prior to induction Oxygen Delivery Method: Circle system utilized Preoxygenation: Pre-oxygenation with 100% oxygen Induction Type: IV induction Ventilation: Mask ventilation without difficulty and Oral airway inserted - appropriate to patient size Laryngoscope Size: Glidescope and 3 Grade View: Grade I Tube type: Oral Tube size: 8.0 mm Number of attempts: 1 Airway Equipment and Method: Rigid stylet and Video-laryngoscopy Placement Confirmation: ETT inserted through vocal cords under direct vision, positive ETCO2 and breath sounds checked- equal and bilateral Secured at: 25 cm Tube secured with: Tape Dental Injury: Teeth and Oropharynx as per pre-operative assessment

## 2020-10-10 NOTE — Anesthesia Procedure Notes (Signed)
Procedure Name: Intubation Date/Time: 10/10/2020 4:04 PM Performed by: Lelon Perla, CRNA Pre-anesthesia Checklist: Patient identified, Emergency Drugs available, Suction available and Patient being monitored Patient Re-evaluated:Patient Re-evaluated prior to induction Oxygen Delivery Method: Circle system utilized Preoxygenation: Pre-oxygenation with 100% oxygen Induction Type: IV induction Ventilation: Mask ventilation without difficulty and Oral airway inserted - appropriate to patient size Laryngoscope Size: Glidescope and 4 Grade View: Grade I Tube type: Oral Tube size: 8.0 mm Number of attempts: 1 Airway Equipment and Method: Stylet and Oral airway Placement Confirmation: ETT inserted through vocal cords under direct vision, positive ETCO2 and breath sounds checked- equal and bilateral Secured at: 22 cm Tube secured with: Tape Dental Injury: Teeth and Oropharynx as per pre-operative assessment

## 2020-10-10 NOTE — Interval H&P Note (Signed)
History and Physical Interval Note:  10/10/2020 6:51 AM  Robert Li  has presented today for surgery, with the diagnosis of AS.  The various methods of treatment have been discussed with the patient and family. After consideration of risks, benefits and other options for treatment, the patient has consented to  Procedure(s): AORTIC VALVE REPLACEMENT (AVR) (N/A) TRANSESOPHAGEAL ECHOCARDIOGRAM (TEE) (N/A) as a surgical intervention.  The patient's history has been reviewed, patient examined, no change in status, stable for surgery.  I have reviewed the patient's chart and labs.  Questions were answered to the patient's satisfaction.     [name]

## 2020-10-10 NOTE — Brief Op Note (Signed)
10/10/2020  10:50 AM  PATIENT:  Robert Li  65 y.o. male  PRE-OPERATIVE DIAGNOSIS:  Critical aortic stenosis  POST-OPERATIVE DIAGNOSIS:  Critical aortic stenosis  PROCEDURE:  TRANSESOPHAGEAL ECHOCARDIOGRAM (TEE), AORTIC VALVE REPLACEMENT (AVR) (using an Inspiris bioprosthetic valve Model #11500A, Serial # [id number], Size 23 mm), APPLICATION OF CELL SAVER  SURGEON:  Surgeon(s) and Role:    [name], MD - Primary  PHYSICIAN ASSISTANT: Doree Fudge PA-C  ASSISTANTS: Kristie Cowman RNFA   ANESTHESIA:   general  EBL:  Per anesthesia, perfusion record  DRAINS:  Chest tubes placed in the mediastinal and pleural spaces    SPECIMEN:  Source of Specimen:  Native aortic valve leaflets   DISPOSITION OF SPECIMEN:  PATHOLOGY  COUNTS CORRECT:  YES  DICTATION: .Dragon Dictation  PLAN OF CARE: Admit to inpatient   PATIENT DISPOSITION:  ICU - intubated and hemodynamically stable.   Delay start of Pharmacological VTE agent (>24hrs) due to surgical blood loss or risk of bleeding: yes  BASELINE WEIGHT: 62.6 kg

## 2020-10-10 NOTE — Anesthesia Procedure Notes (Signed)
Central Venous Catheter Insertion Performed by: Roderic Palau, MD, anesthesiologist Start/End8/19/2022 6:55 AM, 10/10/2020 7:10 AM Patient location: Pre-op. Preanesthetic checklist: patient identified, IV checked, site marked, risks and benefits discussed, surgical consent, monitors and equipment checked, pre-op evaluation, timeout performed and anesthesia consent Position: Trendelenburg Lidocaine 1% used for infiltration and patient sedated Hand hygiene performed , maximum sterile barriers used  and Seldinger technique used Catheter size: 9 Fr Total catheter length 10. Central line was placed.MAC introducer Procedure performed using ultrasound guided technique. Ultrasound Notes:anatomy identified, needle tip was noted to be adjacent to the nerve/plexus identified, no ultrasound evidence of intravascular and/or intraneural injection and image(s) printed for medical record Attempts: 1 Following insertion, line sutured, dressing applied and Biopatch. Post procedure assessment: blood return through all ports, free fluid flow and no air  Patient tolerated the procedure well with no immediate complications.

## 2020-10-10 NOTE — Progress Notes (Signed)
      [address]       [place] [address]             [phone]      S/p AVR  Events of earlier today noted  BP 120/79   Pulse 80   Temp 97.9 F (36.6 C)   Resp (!) 23   Ht 5\' 6"  (1.676 m)   Wt 62.6 kg   SpO2 100%   BMI 22.27 kg/m  26/16 CI= 2.0  Intake/Output Summary (Last 24 hours) at 10/10/2020 1940 Last data filed at 10/10/2020 1800 Gross per 24 hour  Intake 4279.95 ml  Output 3942 ml  Net 337.95 ml   Currently awake and animated, moving all 4 ext  Will keep intubated overnight and wean in AM  Kaulin Chaves C. 10/12/2020, MD [facility] [phone]

## 2020-10-10 NOTE — Anesthesia Procedure Notes (Signed)
Arterial Line Insertion Start/End8/19/2022 7:05 AM, 10/10/2020 7:10 AM Performed by: Gaynelle Adu, MD, Macie Burows, CRNA, CRNA  Patient location: Pre-op. Preanesthetic checklist: patient identified, IV checked, site marked, risks and benefits discussed, surgical consent, monitors and equipment checked, pre-op evaluation, timeout performed and anesthesia consent Lidocaine 1% used for infiltration Right, radial was placed Catheter size: 20 G Hand hygiene performed  and maximum sterile barriers used   Attempts: 1 Procedure performed without using ultrasound guided technique. Following insertion, dressing applied and Biopatch. Post procedure assessment: normal and unchanged

## 2020-10-10 NOTE — Progress Notes (Signed)
Patient ID: Robert Li, male   DOB: 12-24-1955, 65 y.o.   MRN: [id number] TCTS:  He has returned from CTA which I have reviewed with neurologist. There is no sign of major vessel occlusion in neck or intracranial. Aortic arch looks fine with no sign of aortic dissection. He is hemodynamically stable in paced rhythm. Will plan to keep sedated on vent overnight and plan to wean to extubate in am if no changes. Discussed status and plans with wife and son at bedside.

## 2020-10-11 ENCOUNTER — Inpatient Hospital Stay (HOSPITAL_COMMUNITY): Payer: Medicare Other

## 2020-10-11 LAB — CBC
HCT: 31.2 % — ABNORMAL LOW (ref 39.0–52.0)
HCT: 36.2 % — ABNORMAL LOW (ref 39.0–52.0)
Hemoglobin: 10.7 g/dL — ABNORMAL LOW (ref 13.0–17.0)
Hemoglobin: 12.2 g/dL — ABNORMAL LOW (ref 13.0–17.0)
MCH: 32.7 pg (ref 26.0–34.0)
MCH: 32.9 pg (ref 26.0–34.0)
MCHC: 34.3 g/dL (ref 30.0–36.0)
MCHC: 33.7 g/dL (ref 30.0–36.0)
MCV: 95.4 fL (ref 80.0–100.0)
MCV: 97.6 fL (ref 80.0–100.0)
Platelets: 75 10*3/uL — ABNORMAL LOW (ref 150–400)
Platelets: 88 10*3/uL — ABNORMAL LOW (ref 150–400)
RBC: 3.27 MIL/uL — ABNORMAL LOW (ref 4.22–5.81)
RBC: 3.71 MIL/uL — ABNORMAL LOW (ref 4.22–5.81)
RDW: 12.8 % (ref 11.5–15.5)
RDW: 13.2 % (ref 11.5–15.5)
WBC: 6.3 10*3/uL (ref 4.0–10.5)
WBC: 12.8 10*3/uL — ABNORMAL HIGH (ref 4.0–10.5)
nRBC: 0 % (ref 0.0–0.2)
nRBC: 0 % (ref 0.0–0.2)

## 2020-10-11 LAB — GLUCOSE, CAPILLARY
Glucose-Capillary: 101 mg/dL — ABNORMAL HIGH (ref 70–99)
Glucose-Capillary: 106 mg/dL — ABNORMAL HIGH (ref 70–99)
Glucose-Capillary: 115 mg/dL — ABNORMAL HIGH (ref 70–99)
Glucose-Capillary: 116 mg/dL — ABNORMAL HIGH (ref 70–99)
Glucose-Capillary: 130 mg/dL — ABNORMAL HIGH (ref 70–99)
Glucose-Capillary: 132 mg/dL — ABNORMAL HIGH (ref 70–99)
Glucose-Capillary: 133 mg/dL — ABNORMAL HIGH (ref 70–99)
Glucose-Capillary: 137 mg/dL — ABNORMAL HIGH (ref 70–99)
Glucose-Capillary: 145 mg/dL — ABNORMAL HIGH (ref 70–99)
Glucose-Capillary: 95 mg/dL (ref 70–99)

## 2020-10-11 LAB — POCT I-STAT 7, (LYTES, BLD GAS, ICA,H+H)
Acid-base deficit: 2 mmol/L (ref 0.0–2.0)
Bicarbonate: 21.6 mmol/L (ref 20.0–28.0)
Calcium, Ion: 1.08 mmol/L — ABNORMAL LOW (ref 1.15–1.40)
HCT: 31 % — ABNORMAL LOW (ref 39.0–52.0)
Hemoglobin: 10.5 g/dL — ABNORMAL LOW (ref 13.0–17.0)
O2 Saturation: 98 %
Patient temperature: 36.1
Potassium: 3.7 mmol/L (ref 3.5–5.1)
Sodium: 139 mmol/L (ref 135–145)
TCO2: 23 mmol/L (ref 22–32)
pCO2 arterial: 31.5 mmHg — ABNORMAL LOW (ref 32.0–48.0)
pH, Arterial: 7.44 (ref 7.350–7.450)
pO2, Arterial: 89 mmHg (ref 83.0–108.0)

## 2020-10-11 LAB — BASIC METABOLIC PANEL
Anion gap: 7 (ref 5–15)
Anion gap: 10 (ref 5–15)
BUN: 6 mg/dL — ABNORMAL LOW (ref 8–23)
BUN: 6 mg/dL — ABNORMAL LOW (ref 8–23)
CO2: 22 mmol/L (ref 22–32)
CO2: 19 mmol/L — ABNORMAL LOW (ref 22–32)
Calcium: 7.5 mg/dL — ABNORMAL LOW (ref 8.9–10.3)
Calcium: 8 mg/dL — ABNORMAL LOW (ref 8.9–10.3)
Chloride: 106 mmol/L (ref 98–111)
Chloride: 106 mmol/L (ref 98–111)
Creatinine, Ser: 0.72 mg/dL (ref 0.61–1.24)
Creatinine, Ser: 0.96 mg/dL (ref 0.61–1.24)
GFR, Estimated: >60 mL/min (ref >60)
GFR, Estimated: >60 mL/min (ref >60)
Glucose, Bld: 117 mg/dL — ABNORMAL HIGH (ref 70–99)
Glucose, Bld: 124 mg/dL — ABNORMAL HIGH (ref 70–99)
Potassium: 3.6 mmol/L (ref 3.5–5.1)
Potassium: 4.1 mmol/L (ref 3.5–5.1)
Sodium: 135 mmol/L (ref 135–145)
Sodium: 135 mmol/L (ref 135–145)

## 2020-10-11 LAB — MAGNESIUM
Magnesium: 2.4 mg/dL (ref 1.7–2.4)
Magnesium: 2.1 mg/dL (ref 1.7–2.4)

## 2020-10-11 MED ORDER — POTASSIUM CHLORIDE 10 MEQ/50ML IV SOLN
10.0000 meq | INTRAVENOUS | Status: AC
  Administered 2020-10-11 (×3): 10 meq via INTRAVENOUS
  Filled 2020-10-11 (×3): qty 50

## 2020-10-11 MED ORDER — ORAL CARE MOUTH RINSE
15.0000 mL | Freq: Two times a day (BID) | OROMUCOSAL | Status: DC
  Administered 2020-10-11 – 2020-10-14 (×5): 15 mL via OROMUCOSAL

## 2020-10-11 MED ORDER — INSULIN ASPART 100 UNIT/ML IJ SOLN
0.0000 [IU] | INTRAMUSCULAR | Status: DC
  Administered 2020-10-11: 4 [IU] via SUBCUTANEOUS

## 2020-10-11 MED ORDER — FUROSEMIDE 10 MG/ML IJ SOLN
20.0000 mg | Freq: Two times a day (BID) | INTRAMUSCULAR | Status: DC
  Administered 2020-10-11 – 2020-10-12 (×3): 20 mg via INTRAVENOUS
  Filled 2020-10-11 (×3): qty 2

## 2020-10-11 MED ORDER — INSULIN DETEMIR 100 UNIT/ML ~~LOC~~ SOLN
10.0000 [IU] | Freq: Two times a day (BID) | SUBCUTANEOUS | Status: DC
  Administered 2020-10-11 (×2): 10 [IU] via SUBCUTANEOUS
  Filled 2020-10-11 (×5): qty 0.1

## 2020-10-11 NOTE — Progress Notes (Signed)
1 Day Post-Op Procedure(s) (LRB): AORTIC VALVE REPLACEMENT (AVR) WITH INSPIRIS AORTIC VALVE (N/A) TRANSESOPHAGEAL ECHOCARDIOGRAM (TEE) (N/A) APPLICATION OF CELL SAVER Subjective: Motioning he wants ETT out  Objective: Vital signs in last 24 hours: Temp:  [96.4 F (35.8 C)-98.4 F (36.9 C)] 97.2 F (36.2 C) (08/20 0800) Pulse Rate:  [40-81] 80 (08/20 0800) Resp:  [11-27] 22 (08/20 0800) BP: (73-149)/(58-92) 92/73 (08/20 0800) SpO2:  [98 %-100 %] 100 % (08/20 0800) Arterial Line BP: (86-162)/(49-97) 107/64 (08/20 0800) FiO2 (%):  [40 %-100 %] 40 % (08/20 0800)  Hemodynamic parameters for last 24 hours: PAP: (18-33)/(8-20) 27/19 CO:  [3 L/min-4 L/min] 3.1 L/min CI:  [1.8 L/min/m2-2.3 L/min/m2] 1.8 L/min/m2  Intake/Output from previous day: 08/19 0701 - 08/20 0700 In: 5803.1 [I.V.:3598; Blood:350; IV Piggyback:1855.1] Out: 5682 [Urine:4845; Blood:477; Chest Tube:360] Intake/Output this shift: Total I/O In: 50.8 [I.V.:20.8; IV Piggyback:30] Out: -   General appearance: alert, cooperative, and no distress Neurologic: intact Heart: regular rate and rhythm Lungs: diminished breath sounds bibasilar Abdomen: normal findings: soft, non-tender  Lab Results: Recent Labs    10/10/20 1836 10/11/20 0422  WBC 9.6 6.3  HGB 11.5* 10.7*  HCT 33.1* 31.2*  PLT 82* 75*   BMET:  Recent Labs    10/10/20 1836 10/11/20 0422  NA 138 135  K 3.9 3.6  CL 107 106  CO2 23 22  GLUCOSE 137* 117*  BUN 9 6*  CREATININE 0.78 0.72  CALCIUM 7.1* 7.5*    PT/INR:  Recent Labs    10/10/20 1617  LABPROT 16.0*  INR 1.3*   ABG    Component Value Date/Time   PHART 7.424 10/10/2020 1740   HCO3 20.5 10/10/2020 1740   TCO2 22 10/10/2020 1740   ACIDBASEDEF 3.0 (H) 10/10/2020 1740   O2SAT 100.0 10/10/2020 1740   CBG (last 3)  Recent Labs    10/11/20 0012 10/11/20 0219 10/11/20 0404  GLUCAP 145* 130* 132*    Assessment/Plan: S/P Procedure(s) (LRB): AORTIC VALVE REPLACEMENT  (AVR) WITH INSPIRIS AORTIC VALVE (N/A) TRANSESOPHAGEAL ECHOCARDIOGRAM (TEE) (N/A) APPLICATION OF CELL SAVER POD # 1 AVR NEURO- events of yesterday noted, EEG negative, Ct negative ' Appears intact this AM, Neuro following  CV- hemodynamics stable, ECG shows RBBB not unusual after AVR, monitor   RESP- reintubated yesterday for neuro changes  Good oxygenation, will extubate this AM  RENAL- creatinine normal, K supplemented  Diurese  ENDO- CBG well controlled with insulin drip   Transition to levemir + SSI  GI- advance diet post extubation  HEME- anemia secondary to ABL- mild, follow  Thrombocytopenia- no enoxaparin, monitor  Dc chest tubes   LOS: 1 day    Robert Li 10/11/2020

## 2020-10-11 NOTE — Procedures (Signed)
Extubation Procedure Note  Patient Details:   Name: Robert Li DOB: [birth year] MRN: [id number]   Airway Documentation:    Vent end date: 10/11/20 Vent end time: 0840   Evaluation  O2 sats: stable throughout Complications: No apparent complications Patient did tolerate procedure well. Bilateral Breath Sounds: Clear   Yes  Pt was extubated per MD order and placed on 3 L [place]. Cuff leak was noted prior to extubation and no stridor post. Pt is stable at thsi time. Rt will monitor.   Merlene Laughter 10/11/2020, 8:46 AM

## 2020-10-11 NOTE — Progress Notes (Signed)
Looks good. Awake talking after extubation. Available PRN.  Myrla Halsted MD PCCM

## 2020-10-11 NOTE — Progress Notes (Signed)
Patient is doing well today.  He is extubated.  He is oriented to month, place, events leading up to the hospitalization.  He does not remember much about the events of yesterday afternoon, however.  He states that he was quite concerned when he woke up this morning and "could not talk" because of the breathing tube.   Since extubation, he has been doing well per his wife, just maybe some mild confusion.  At this time, I think the most likely explanation would be a transient basilar occlusion, possibly from retained air embolus versus clot that rapidly lysed.  I do not think I would make any changes to his management based on this event at this time.  If he were to have further events, then could consider seizure at a future date, but I do not think any further evaluation is likely to be helpful at this time.  Neurology will be available on an as-needed basis, please call with further questions or concerns.  Ritta Slot, MD [facility] [phone]  If 7pm- 7am, please page neurology on call as listed in AMION.

## 2020-10-11 NOTE — Progress Notes (Signed)
      [address]       [place] [address]             [phone]      Alert, no complaints  BP 111/67   Pulse 80   Temp 98.8 F (37.1 C)   Resp (!) 25   Ht 5\' 6"  (1.676 m)   Wt 62.6 kg   SpO2 94%   BMI 22.27 kg/m   Intake/Output Summary (Last 24 hours) at 10/11/2020 1743 Last data filed at 10/11/2020 1500 Gross per 24 hour  Intake 2709.83 ml  Output 3540 ml  Net -830.17 ml   K and Cr ok Hct= 36, PLT 88K up slightly  Doing well POD # 1  Robert Li C. 10/13/2020, MD [facility] [phone]

## 2020-10-11 NOTE — Progress Notes (Signed)
[facility] notified. No skin breakdown. 

## 2020-10-11 NOTE — Procedures (Addendum)
Patient Name: Robert Li  MRN: [id number]  Epilepsy Attending: Charlsie Quest  Referring Physician/Provider: Jimmye Norman, NP Duration: 10/10/2020 2159 to 10/11/2020 3112   Patient history: 65 yo male who underwent aortic valve replacement today for severe AS who became unresponsive with unclear etiology. EEG to evaluate for seizure   Level of alertness: asleep/sedated   AEDs during EEG study: None   Technical aspects: This EEG study was done with scalp electrodes positioned according to the 10-20 International system of electrode placement. Electrical activity was acquired at a sampling rate of 500Hz  and reviewed with a high frequency filter of 70Hz  and a low frequency filter of 1Hz . EEG data were recorded continuously and digitally stored.    Description: EEG showed continuous generalized 3 to 6 Hz theta-delta slowing. Hyperventilation and photic stimulation were not performed.      ABNORMALITY - Continuous slow, generalized   IMPRESSION: This study is suggestive of moderate to severe diffuse encephalopathy, nonspecific etiology but likely related to sedation. No seizures or epileptiform discharges were seen throughout the recording.   Mclane Arora 

## 2020-10-12 ENCOUNTER — Inpatient Hospital Stay (HOSPITAL_COMMUNITY): Payer: Medicare Other

## 2020-10-12 LAB — BASIC METABOLIC PANEL
Anion gap: 6 (ref 5–15)
BUN: 8 mg/dL (ref 8–23)
CO2: 26 mmol/L (ref 22–32)
Calcium: 8.1 mg/dL — ABNORMAL LOW (ref 8.9–10.3)
Chloride: 103 mmol/L (ref 98–111)
Creatinine, Ser: 0.82 mg/dL (ref 0.61–1.24)
GFR, Estimated: >60 mL/min (ref >60)
Glucose, Bld: 74 mg/dL (ref 70–99)
Potassium: 3.4 mmol/L — ABNORMAL LOW (ref 3.5–5.1)
Sodium: 135 mmol/L (ref 135–145)

## 2020-10-12 LAB — GLUCOSE, CAPILLARY
Glucose-Capillary: 77 mg/dL (ref 70–99)
Glucose-Capillary: 131 mg/dL — ABNORMAL HIGH (ref 70–99)
Glucose-Capillary: 99 mg/dL (ref 70–99)
Glucose-Capillary: 112 mg/dL — ABNORMAL HIGH (ref 70–99)

## 2020-10-12 LAB — CBC
HCT: 33.4 % — ABNORMAL LOW (ref 39.0–52.0)
Hemoglobin: 11 g/dL — ABNORMAL LOW (ref 13.0–17.0)
MCH: 32.4 pg (ref 26.0–34.0)
MCHC: 32.9 g/dL (ref 30.0–36.0)
MCV: 98.5 fL (ref 80.0–100.0)
Platelets: 77 10*3/uL — ABNORMAL LOW (ref 150–400)
RBC: 3.39 MIL/uL — ABNORMAL LOW (ref 4.22–5.81)
RDW: 13.2 % (ref 11.5–15.5)
WBC: 9.7 10*3/uL (ref 4.0–10.5)
nRBC: 0 % (ref 0.0–0.2)

## 2020-10-12 MED ORDER — ALUM & MAG HYDROXIDE-SIMETH 200-200-20 MG/5ML PO SUSP: 15.0000 mL | Freq: Four times a day (QID) | ORAL | Status: DC | PRN

## 2020-10-12 MED ORDER — SODIUM CHLORIDE 0.9% FLUSH
3.0000 mL | Freq: Two times a day (BID) | INTRAVENOUS | Status: DC
  Administered 2020-10-12 – 2020-10-14 (×7): 3 mL via INTRAVENOUS

## 2020-10-12 MED ORDER — INSULIN ASPART 100 UNIT/ML IJ SOLN: 0.0000 [IU] | Freq: Every day | INTRAMUSCULAR | Status: DC

## 2020-10-12 MED ORDER — INSULIN ASPART 100 UNIT/ML IJ SOLN
0.0000 [IU] | Freq: Three times a day (TID) | INTRAMUSCULAR | Status: DC
  Administered 2020-10-12: 2 [IU] via SUBCUTANEOUS
  Administered 2020-10-13: 5 [IU] via SUBCUTANEOUS
  Administered 2020-10-14: 3 [IU] via SUBCUTANEOUS

## 2020-10-12 MED ORDER — ~~LOC~~ CARDIAC SURGERY, PATIENT & FAMILY EDUCATION
Freq: Once | Status: AC
  Administered 2020-10-12: 1

## 2020-10-12 MED ORDER — SODIUM CHLORIDE 0.9% FLUSH: 3.0000 mL | INTRAVENOUS | Status: DC | PRN

## 2020-10-12 MED ORDER — SODIUM CHLORIDE 0.9 % IV SOLN: 250.0000 mL | INTRAVENOUS | Status: DC | PRN

## 2020-10-12 MED ORDER — POTASSIUM CHLORIDE 10 MEQ/50ML IV SOLN
10.0000 meq | INTRAVENOUS | Status: AC
Start: 2020-10-12 — End: 2020-10-12
  Administered 2020-10-12 (×3): 10 meq via INTRAVENOUS
  Filled 2020-10-12 (×3): qty 50

## 2020-10-12 MED ORDER — POTASSIUM CHLORIDE ER 10 MEQ PO TBCR
20.0000 meq | EXTENDED_RELEASE_TABLET | Freq: Three times a day (TID) | ORAL | Status: AC
  Administered 2020-10-12 (×3): 20 meq via ORAL
  Filled 2020-10-12 (×6): qty 2

## 2020-10-12 MED ORDER — MAGNESIUM HYDROXIDE 400 MG/5ML PO SUSP: 30.0000 mL | Freq: Every day | ORAL | Status: DC | PRN

## 2020-10-12 MED ORDER — ALPRAZOLAM 0.25 MG PO TABS: 0.2500 mg | ORAL_TABLET | Freq: Three times a day (TID) | ORAL | Status: DC | PRN

## 2020-10-12 NOTE — Progress Notes (Signed)
Courtenay RN aware of order to remove CVC.  Plan on removing after meds infuse.

## 2020-10-12 NOTE — Progress Notes (Signed)
      [address]       [place] [address]             [phone]      Some nausea earlier  BP 119/80   Pulse 78   Temp 98.3 F (36.8 C) (Oral)   Resp (!) 35   Ht 5\' 6"  (1.676 m)   Wt 63.8 kg   SpO2 99%   BMI 22.70 kg/m   Intake/Output Summary (Last 24 hours) at 10/12/2020 1821 Last data filed at 10/12/2020 1615 Gross per 24 hour  Intake 751.48 ml  Output 1605 ml  Net -853.52 ml   Awaiting bed on 4E  Syriah Delisi C. 10/14/2020, MD [facility] [phone]

## 2020-10-12 NOTE — Progress Notes (Signed)
2 Days Post-Op Procedure(s) (LRB): AORTIC VALVE REPLACEMENT (AVR) WITH INSPIRIS AORTIC VALVE (N/A) TRANSESOPHAGEAL ECHOCARDIOGRAM (TEE) (N/A) APPLICATION OF CELL SAVER Subjective: No complaints this AM  Objective: Vital signs in last 24 hours: Temp:  [97.3 F (36.3 C)-99.2 F (37.3 C)] 98.5 F (36.9 C) (08/21 0744) Pulse Rate:  [44-95] 77 (08/21 0700) Resp:  [15-33] 22 (08/21 0700) BP: (85-140)/(54-101) 133/66 (08/21 0500) SpO2:  [90 %-100 %] 98 % (08/21 0700) Arterial Line BP: (104-145)/(42-64) 115/47 (08/20 1400) Weight:  [63.8 kg] 63.8 kg (08/21 0600)  Hemodynamic parameters for last 24 hours: PAP: (27-36)/(16-24) 30/20  Intake/Output from previous day: 08/20 0701 - 08/21 0700 In: 1212.7 [P.O.:360; I.V.:438.9; IV Piggyback:413.8] Out: 1855 [Urine:1755; Chest Tube:100] Intake/Output this shift: Total I/O In: -  Out: 325 [Urine:325]  General appearance: alert, cooperative, and no distress Neurologic: intact Heart: regular rate and rhythm Lungs: diminished breath sounds bibasilar Abdomen: normal findings: soft, non-tender Wound: clean and dry  Lab Results: Recent Labs    10/11/20 1437 10/12/20 0450  WBC 12.8* 9.7  HGB 12.2* 11.0*  HCT 36.2* 33.4*  PLT 88* 77*   BMET:  Recent Labs    10/11/20 1437 10/12/20 0450  NA 135 135  K 4.1 3.4*  CL 106 103  CO2 19* 26  GLUCOSE 124* 74  BUN 6* 8  CREATININE 0.96 0.82  CALCIUM 8.0* 8.1*    PT/INR:  Recent Labs    10/10/20 1617  LABPROT 16.0*  INR 1.3*   ABG    Component Value Date/Time   PHART 7.440 10/11/2020 0407   HCO3 21.6 10/11/2020 0407   TCO2 23 10/11/2020 0407   ACIDBASEDEF 2.0 10/11/2020 0407   O2SAT 98.0 10/11/2020 0407   CBG (last 3)  Recent Labs    10/11/20 1946 10/11/20 2347 10/12/20 0741  GLUCAP 116* 101* 77    Assessment/Plan: S/P Procedure(s) (LRB): AORTIC VALVE REPLACEMENT (AVR) WITH INSPIRIS AORTIC VALVE (N/A) TRANSESOPHAGEAL ECHOCARDIOGRAM (TEE)  (N/A) APPLICATION OF CELL SAVER Plan for transfer to step-down: see transfer orders NEURO- exam normal, no further issues CV- in SR  ASA for tissue AVR, no coumadin unless A fib RESP- CXR OK, IS RENAL- creatinine normal, weight minimally up  Hypokalemia - supplement ENDO- CBG well controlled  Dc levemir, change SSI to AC and HS  Restart pO meds tomorrow GI- tolerating PO Anemia secondary to ABL- mld, follow Thrombocytopenia- stable, no heparin Cardiac rehab Dc central line and Foley   LOS: 2 days    Loreli Slot 10/12/2020

## 2020-10-13 ENCOUNTER — Other Ambulatory Visit: Payer: Self-pay

## 2020-10-13 ENCOUNTER — Inpatient Hospital Stay (HOSPITAL_COMMUNITY): Payer: Medicare Other

## 2020-10-13 LAB — BASIC METABOLIC PANEL
Anion gap: 10 (ref 5–15)
BUN: 11 mg/dL (ref 8–23)
CO2: 21 mmol/L — ABNORMAL LOW (ref 22–32)
Calcium: 8.1 mg/dL — ABNORMAL LOW (ref 8.9–10.3)
Chloride: 103 mmol/L (ref 98–111)
Creatinine, Ser: 0.86 mg/dL (ref 0.61–1.24)
GFR, Estimated: >60 mL/min (ref >60)
Glucose, Bld: 112 mg/dL — ABNORMAL HIGH (ref 70–99)
Potassium: 4.3 mmol/L (ref 3.5–5.1)
Sodium: 134 mmol/L — ABNORMAL LOW (ref 135–145)

## 2020-10-13 LAB — CBC
HCT: 35.6 % — ABNORMAL LOW (ref 39.0–52.0)
Hemoglobin: 11.7 g/dL — ABNORMAL LOW (ref 13.0–17.0)
MCH: 32.5 pg (ref 26.0–34.0)
MCHC: 32.9 g/dL (ref 30.0–36.0)
MCV: 98.9 fL (ref 80.0–100.0)
Platelets: 86 10*3/uL — ABNORMAL LOW (ref 150–400)
RBC: 3.6 MIL/uL — ABNORMAL LOW (ref 4.22–5.81)
RDW: 13.1 % (ref 11.5–15.5)
WBC: 9.5 10*3/uL (ref 4.0–10.5)
nRBC: 0 % (ref 0.0–0.2)

## 2020-10-13 LAB — GLUCOSE, CAPILLARY
Glucose-Capillary: 96 mg/dL (ref 70–99)
Glucose-Capillary: 229 mg/dL — ABNORMAL HIGH (ref 70–99)
Glucose-Capillary: 93 mg/dL (ref 70–99)
Glucose-Capillary: 162 mg/dL — ABNORMAL HIGH (ref 70–99)

## 2020-10-13 LAB — SURGICAL PATHOLOGY

## 2020-10-13 MED ORDER — FUROSEMIDE 40 MG PO TABS
40.0000 mg | ORAL_TABLET | Freq: Every day | ORAL | Status: AC
  Administered 2020-10-13 – 2020-10-14 (×2): 40 mg via ORAL
  Filled 2020-10-13 (×2): qty 1

## 2020-10-13 MED ORDER — POTASSIUM CHLORIDE CRYS ER 20 MEQ PO TBCR
20.0000 meq | EXTENDED_RELEASE_TABLET | Freq: Two times a day (BID) | ORAL | Status: AC
  Administered 2020-10-13 – 2020-10-14 (×4): 20 meq via ORAL
  Filled 2020-10-13 (×4): qty 1

## 2020-10-13 MED ORDER — METOPROLOL TARTRATE 25 MG PO TABS
25.0000 mg | ORAL_TABLET | Freq: Two times a day (BID) | ORAL | Status: DC
  Administered 2020-10-13 – 2020-10-15 (×5): 25 mg via ORAL
  Filled 2020-10-13 (×5): qty 1

## 2020-10-13 MED ORDER — METOPROLOL TARTRATE 25 MG/10 ML ORAL SUSPENSION: 25.0000 mg | Freq: Two times a day (BID) | ORAL | Status: DC

## 2020-10-13 MED FILL — Thrombin (Recombinant) For Soln 20000 Unit: CUTANEOUS | Qty: 1 | Status: AC

## 2020-10-13 NOTE — Progress Notes (Signed)
EVENING ROUNDS NOTE :     [address]       [place] [address]             [phone]                 3 Days Post-Op Procedure(s) (LRB): AORTIC VALVE REPLACEMENT (AVR) WITH INSPIRIS AORTIC VALVE (N/A) TRANSESOPHAGEAL ECHOCARDIOGRAM (TEE) (N/A) APPLICATION OF CELL SAVER   Total Length of Stay:  LOS: 3 days  Events:   No events Awaiting floor bed    BP 107/68   Pulse 81   Temp 98.7 F (37.1 C) (Oral)   Resp (!) 26   Ht 5\' 6"  (1.676 m)   Wt 66.2 kg   SpO2 94%   BMI 23.56 kg/m          sodium chloride      I/O last 3 completed shifts: In: 1851.5 [P.O.:1660; I.V.:86; IV Piggyback:105.5] Out: 2700 [Urine:2700]   CBC Latest Ref Rng & Units 10/13/2020 10/12/2020 10/11/2020  WBC 4.0 - 10.5 K/uL 9.5 9.7 12.8(H)  Hemoglobin 13.0 - 17.0 g/dL 11.7(L) 11.0(L) 12.2(L)  Hematocrit 39.0 - 52.0 % 35.6(L) 33.4(L) 36.2(L)  Platelets 150 - 400 K/uL 86(L) 77(L) 88(L)    BMP Latest Ref Rng & Units 10/13/2020 10/12/2020 10/11/2020  Glucose 70 - 99 mg/dL 10/13/2020) 74 132(G)  BUN 8 - 23 mg/dL 11 8 6(L)  Creatinine 401(U - 1.24 mg/dL 2.72 5.36 6.44  BUN/Creat Ratio 10 - 24 - - -  Sodium 135 - 145 mmol/L 134(L) 135 135  Potassium 3.5 - 5.1 mmol/L 4.3 3.4(L) 4.1  Chloride 98 - 111 mmol/L 103 103 106  CO2 22 - 32 mmol/L 21(L) 26 19(L)  Calcium 8.9 - 10.3 mg/dL 8.1(L) 8.1(L) 8.0(L)    ABG    Component Value Date/Time   PHART 7.440 10/11/2020 0407   PCO2ART 31.5 (L) 10/11/2020 0407   PO2ART 89 10/11/2020 0407   HCO3 21.6 10/11/2020 0407   TCO2 23 10/11/2020 0407   ACIDBASEDEF 2.0 10/11/2020 0407   O2SAT 98.0 10/11/2020 0407       10/13/2020, MD 10/13/2020 7:26 PM

## 2020-10-13 NOTE — Progress Notes (Signed)
3 Days Post-Op Procedure(s) (LRB): AORTIC VALVE REPLACEMENT (AVR) WITH INSPIRIS AORTIC VALVE (N/A) TRANSESOPHAGEAL ECHOCARDIOGRAM (TEE) (N/A) APPLICATION OF CELL SAVER Subjective: No complaints  Objective: Vital signs in last 24 hours: Temp:  [98.1 F (36.7 C)-99.1 F (37.3 C)] 98.6 F (37 C) (08/22 0600) Pulse Rate:  [70-95] 81 (08/22 0600) Resp:  [14-37] 28 (08/22 0600) BP: (102-161)/(56-100) 155/89 (08/22 0600) SpO2:  [93 %-100 %] 93 % (08/22 0600) Weight:  [66.2 kg] 66.2 kg (08/22 0400)  Hemodynamic parameters for last 24 hours:    Intake/Output from previous day: 08/21 0701 - 08/22 0700 In: 1031.5 [P.O.:840; I.V.:86; IV Piggyback:105.5] Out: 1605 [Urine:1605] Intake/Output this shift: No intake/output data recorded.  General appearance: alert and cooperative Neurologic: intact Heart: regular rate and rhythm Lungs: clear to auscultation bilaterally Extremities: no edema Wound: incision looks good.  Lab Results: Recent Labs    10/12/20 0450 10/13/20 0036  WBC 9.7 9.5  HGB 11.0* 11.7*  HCT 33.4* 35.6*  PLT 77* 86*   BMET:  Recent Labs    10/12/20 0450 10/13/20 0036  NA 135 134*  K 3.4* 4.3  CL 103 103  CO2 26 21*  GLUCOSE 74 112*  BUN 8 11  CREATININE 0.82 0.86  CALCIUM 8.1* 8.1*    PT/INR:  Recent Labs    10/10/20 1617  LABPROT 16.0*  INR 1.3*   ABG    Component Value Date/Time   PHART 7.440 10/11/2020 0407   HCO3 21.6 10/11/2020 0407   TCO2 23 10/11/2020 0407   ACIDBASEDEF 2.0 10/11/2020 0407   O2SAT 98.0 10/11/2020 0407   CBG (last 3)  Recent Labs    10/12/20 1540 10/12/20 2306 10/13/20 0640  GLUCAP 99 112* 96   CXR: small bilateral pleural effusions.  Assessment/Plan: S/P Procedure(s) (LRB): AORTIC VALVE REPLACEMENT (AVR) WITH INSPIRIS AORTIC VALVE (N/A) TRANSESOPHAGEAL ECHOCARDIOGRAM (TEE) (N/A) APPLICATION OF CELL SAVER  POD 3 Hemodynamically stable. BP mildly elevated so will increase Lopressor to 25  bid.  Volume excess: -573 cc yesterday but wt up 5 lbs. Doubt accuracy. Will continue lasix for two more days. He does not have any peripheral edema but has small pleural effusions.   DM: glucose under good control on SSI. Resume oral agents at discharge.   Awaiting bed on 4E. Continue diuresis and mobilization. Anticipate home Wednesday.   LOS: 3 days    [name] 10/13/2020

## 2020-10-13 NOTE — Addendum Note (Signed)
Addendum  created 10/13/20 0914 by Adair Laundry, CRNA   Order list changed, Pharmacy for encounter modified

## 2020-10-13 NOTE — Progress Notes (Signed)
CARDIAC REHAB PHASE I   PRE:  Rate/Rhythm: 77 SR trigeminy PVCs    BP: sitting 123/80    SaO2: 95 RA  MODE:  Ambulation: 660 ft   POST:  Rate/Rhythm: 92 SR trigeminy    BP: sitting 122/81     SaO2: 91 RA, up to 95 RA  Pt moving well, out of bed with correct mechanics. Ambulated with RW, steady pace, reminders to stay close. No major c/o, increased distance. To recliner. VSS. Pt performed 1000 ml on IS. Wife present, pt talkative and feeling well. Began discussing smoking cessation. [id number]  Harriet Masson CES, ACSM 10/13/2020 10:48 AM

## 2020-10-14 ENCOUNTER — Encounter (HOSPITAL_COMMUNITY): Payer: Self-pay | Admitting: Surgery

## 2020-10-14 LAB — GLUCOSE, CAPILLARY
Glucose-Capillary: 156 mg/dL — ABNORMAL HIGH (ref 70–99)
Glucose-Capillary: 168 mg/dL — ABNORMAL HIGH (ref 70–99)
Glucose-Capillary: 156 mg/dL — ABNORMAL HIGH (ref 70–99)
Glucose-Capillary: 161 mg/dL — ABNORMAL HIGH (ref 70–99)

## 2020-10-14 MED ORDER — METFORMIN HCL 500 MG PO TABS
500.0000 mg | ORAL_TABLET | Freq: Two times a day (BID) | ORAL | Status: DC
  Administered 2020-10-14 – 2020-10-15 (×3): 500 mg via ORAL
  Filled 2020-10-14 (×3): qty 1

## 2020-10-14 MED ORDER — EMPAGLIFLOZIN 10 MG PO TABS
10.0000 mg | ORAL_TABLET | Freq: Every day | ORAL | Status: DC
  Administered 2020-10-14 – 2020-10-15 (×2): 10 mg via ORAL
  Filled 2020-10-14 (×2): qty 1

## 2020-10-14 MED FILL — Electrolyte-R (PH 7.4) Solution: INTRAVENOUS | Qty: 4000 | Status: AC

## 2020-10-14 MED FILL — Lidocaine HCl Local Preservative Free (PF) Inj 2%: INTRAMUSCULAR | Qty: 15 | Status: AC

## 2020-10-14 MED FILL — Sodium Bicarbonate IV Soln 8.4%: INTRAVENOUS | Qty: 50 | Status: AC

## 2020-10-14 MED FILL — Sodium Chloride IV Soln 0.9%: INTRAVENOUS | Qty: 2000 | Status: AC

## 2020-10-14 MED FILL — Heparin Sodium (Porcine) Inj 1000 Unit/ML: INTRAMUSCULAR | Qty: 30 | Status: CN

## 2020-10-14 MED FILL — Albumin, Human Inj 5%: INTRAVENOUS | Qty: 250 | Status: AC

## 2020-10-14 MED FILL — Mannitol IV Soln 20%: INTRAVENOUS | Qty: 500 | Status: AC

## 2020-10-14 MED FILL — Heparin Sodium (Porcine) Inj 1000 Unit/ML: INTRAMUSCULAR | Qty: 30 | Status: AC

## 2020-10-14 MED FILL — Potassium Chloride Inj 2 mEq/ML: INTRAVENOUS | Qty: 40 | Status: AC

## 2020-10-14 MED FILL — Heparin Sodium (Porcine) Inj 1000 Unit/ML: INTRAMUSCULAR | Qty: 10 | Status: AC

## 2020-10-14 NOTE — Plan of Care (Signed)
  Problem: Clinical Measurements: Goal: Ability to maintain clinical measurements within normal limits will improve Outcome: Adequate for Discharge Goal: Will remain free from infection Outcome: Adequate for Discharge Goal: Diagnostic test results will improve Outcome: Adequate for Discharge Goal: Respiratory complications will improve Outcome: Adequate for Discharge Goal: Cardiovascular complication will be avoided Outcome: Adequate for Discharge   Problem: Activity: Goal: Risk for activity intolerance will decrease Outcome: Adequate for Discharge   Problem: Nutrition: Goal: Adequate nutrition will be maintained Outcome: Adequate for Discharge   Problem: Coping: Goal: Level of anxiety will decrease Outcome: Adequate for Discharge   Problem: Elimination: Goal: Will not experience complications related to bowel motility Outcome: Adequate for Discharge Goal: Will not experience complications related to urinary retention Outcome: Adequate for Discharge   Problem: Pain Managment: Goal: General experience of comfort will improve Outcome: Adequate for Discharge   Problem: Skin Integrity: Goal: Risk for impaired skin integrity will decrease Outcome: Adequate for Discharge   Problem: Activity: Goal: Risk for activity intolerance will decrease Outcome: Adequate for Discharge   Problem: Cardiac: Goal: Will achieve and/or maintain hemodynamic stability Outcome: Adequate for Discharge   Problem: Clinical Measurements: Goal: Postoperative complications will be avoided or minimized Outcome: Adequate for Discharge   Problem: Respiratory: Goal: Respiratory status will improve Outcome: Adequate for Discharge   Problem: Skin Integrity: Goal: Wound healing without signs and symptoms of infection Outcome: Adequate for Discharge Goal: Risk for impaired skin integrity will decrease Outcome: Adequate for Discharge   Problem: Urinary Elimination: Goal: Ability to achieve and  maintain adequate renal perfusion and functioning will improve Outcome: Adequate for Discharge

## 2020-10-14 NOTE — Progress Notes (Signed)
CARDIAC REHAB PHASE I   PRE:  Rate/Rhythm: 76 SR    BP: sitting 134/75    SaO2: 97 RA  MODE:  Ambulation: 960 ft   POST:  Rate/Rhythm: 86 SR    BP: sitting 142/85     SaO2: 94 RA  Pt out of bed and ambulated with standby assist. Moving well, no unsteadiness although pt sts his pain meds make him feel "loopy". Tolerated well. Does not need RW.   Discussed with pt and wife IS, sternal precautions, smoking cessation, diet, exercise, and CRPII. Pt receptive. He is planning on quitting smoking however does not want to quit drinking. Discussed keeping with a plan for smoking cessation and not being tempted by others smoking or beer. Gave resources. Wife supportive. Will refer to [facility] however pt not very interested.  [phone]   Harriet Masson CES, ACSM 10/14/2020 10:50 AM

## 2020-10-14 NOTE — Progress Notes (Signed)
      [address]       [place] [address]             [phone]      POD # 4 AVR  Awaiting 4E bed  BP 129/80 (BP Location: Left Arm)   Pulse (!) 144   Temp 98.6 F (37 C) (Oral)   Resp (!) 23   Ht 5\' 6"  (1.676 m)   Wt 64.4 kg   SpO2 96%   BMI 22.92 kg/m   Intake/Output Summary (Last 24 hours) at 10/14/2020 1905 Last data filed at 10/14/2020 1200 Gross per 24 hour  Intake 840 ml  Output 750 ml  Net 90 ml   No new issues  10/16/2020 C. Viviann Spare, MD [facility] [phone]

## 2020-10-14 NOTE — Progress Notes (Signed)
4 Days Post-Op Procedure(s) (LRB): AORTIC VALVE REPLACEMENT (AVR) WITH INSPIRIS AORTIC VALVE (N/A) TRANSESOPHAGEAL ECHOCARDIOGRAM (TEE) (N/A) APPLICATION OF CELL SAVER Subjective: No complaints.  Objective: Vital signs in last 24 hours: Temp:  [98 F (36.7 C)-98.9 F (37.2 C)] 98.5 F (36.9 C) (08/23 0800) Pulse Rate:  [51-119] 74 (08/23 0700) Cardiac Rhythm: Normal sinus rhythm (08/23 0600) Resp:  [12-28] 18 (08/23 0700) BP: (98-156)/(55-115) 156/93 (08/23 0700) SpO2:  [90 %-100 %] 94 % (08/23 0700) Weight:  [64.4 kg] 64.4 kg (08/23 0500)  Hemodynamic parameters for last 24 hours:    Intake/Output from previous day: 08/22 0701 - 08/23 0700 In: 1180 [P.O.:1180] Out: 1845 [Urine:1845] Intake/Output this shift: No intake/output data recorded.  General appearance: alert and cooperative Neurologic: intact Heart: regular rate and rhythm Lungs: clear to auscultation bilaterally Extremities:no edema Wound: incision healing well.  Lab Results: Recent Labs    10/12/20 0450 10/13/20 0036  WBC 9.7 9.5  HGB 11.0* 11.7*  HCT 33.4* 35.6*  PLT 77* 86*   BMET:  Recent Labs    10/12/20 0450 10/13/20 0036  NA 135 134*  K 3.4* 4.3  CL 103 103  CO2 26 21*  GLUCOSE 74 112*  BUN 8 11  CREATININE 0.82 0.86  CALCIUM 8.1* 8.1*    PT/INR: No results for input(s): LABPROT, INR in the last 72 hours. ABG    Component Value Date/Time   PHART 7.440 10/11/2020 0407   HCO3 21.6 10/11/2020 0407   TCO2 23 10/11/2020 0407   ACIDBASEDEF 2.0 10/11/2020 0407   O2SAT 98.0 10/11/2020 0407   CBG (last 3)  Recent Labs    10/13/20 1631 10/13/20 2152 10/14/20 0633  GLUCAP 93 162* 156*    Assessment/Plan: S/P Procedure(s) (LRB): AORTIC VALVE REPLACEMENT (AVR) WITH INSPIRIS AORTIC VALVE (N/A) TRANSESOPHAGEAL ECHOCARDIOGRAM (TEE) (N/A) APPLICATION OF CELL SAVER  POD 4 Hemodynamically stable in sinus rhythm.  Glucose under adequate control. Will resume home meds for  DM and stop SSI. Follow CBG's today.  Wt down further today with diuresis. 4 lbs over preop. Continue diuresis today.  IS, ambulation  Awaiting 4E bed.   DC pacing wires today. Plan home tomorrow.  LOS: 4 days    [name] 10/14/2020

## 2020-10-15 LAB — GLUCOSE, CAPILLARY: Glucose-Capillary: 141 mg/dL — ABNORMAL HIGH (ref 70–99)

## 2020-10-15 MED ORDER — OXYCODONE HCL 5 MG PO TABS: 5.0000 mg | ORAL_TABLET | Freq: Four times a day (QID) | ORAL | 0 refills | Status: DC | PRN

## 2020-10-15 MED ORDER — METOPROLOL TARTRATE 25 MG PO TABS: 25.0000 mg | ORAL_TABLET | Freq: Two times a day (BID) | ORAL | 1 refills | Status: DC

## 2020-10-15 MED ORDER — ASPIRIN 325 MG PO TBEC: 325.0000 mg | DELAYED_RELEASE_TABLET | Freq: Every day | ORAL | 0 refills | Status: DC

## 2020-10-15 NOTE — Progress Notes (Signed)
5 Days Post-Op Procedure(s) (LRB): AORTIC VALVE REPLACEMENT (AVR) WITH INSPIRIS AORTIC VALVE (N/A) TRANSESOPHAGEAL ECHOCARDIOGRAM (TEE) (N/A) APPLICATION OF CELL SAVER Subjective: No complaints. Already dressed and ready to go home.  Objective: Vital signs in last 24 hours: Temp:  [98 F (36.7 C)-98.8 F (37.1 C)] 98.8 F (37.1 C) (08/24 0000) Pulse Rate:  [77-144] 87 (08/23 2010) Cardiac Rhythm: Normal sinus rhythm;Bundle branch block (08/23 1919) Resp:  [13-23] 13 (08/24 0400) BP: (113-140)/(79-93) 138/82 (08/23 2010) SpO2:  [95 %-97 %] 97 % (08/23 2010) Weight:  [62.8 kg] 62.8 kg (08/24 0500)  Hemodynamic parameters for last 24 hours:    Intake/Output from previous day: 08/23 0701 - 08/24 0700 In: 963 [P.O.:960; I.V.:3] Out: -  Intake/Output this shift: No intake/output data recorded.  General appearance: alert and cooperative Neurologic: intact Heart: regular rate and rhythm Lungs: clear to auscultation bilaterally Extremities: extremities normal, atraumatic, no cyanosis or edema Wound: incision ok  Lab Results: Recent Labs    10/13/20 0036  WBC 9.5  HGB 11.7*  HCT 35.6*  PLT 86*   BMET:  Recent Labs    10/13/20 0036  NA 134*  K 4.3  CL 103  CO2 21*  GLUCOSE 112*  BUN 11  CREATININE 0.86  CALCIUM 8.1*    PT/INR: No results for input(s): LABPROT, INR in the last 72 hours. ABG    Component Value Date/Time   PHART 7.440 10/11/2020 0407   HCO3 21.6 10/11/2020 0407   TCO2 23 10/11/2020 0407   ACIDBASEDEF 2.0 10/11/2020 0407   O2SAT 98.0 10/11/2020 0407   CBG (last 3)  Recent Labs    10/14/20 1624 10/14/20 2156 10/15/20 0647  GLUCAP 156* 161* 141*    Assessment/Plan: S/P Procedure(s) (LRB): AORTIC VALVE REPLACEMENT (AVR) WITH INSPIRIS AORTIC VALVE (N/A) TRANSESOPHAGEAL ECHOCARDIOGRAM (TEE) (N/A) APPLICATION OF CELL SAVER  POD 5  He has been hemodynamically stable in sinus rhythm. Continue lopressor.  Wt back to baseline.  No need for diuretic.  Glucose under good control on home regimen.   Plan discharge today. Chest tube sutures out in office in a week.     LOS: 5 days    [name] 10/15/2020

## 2020-10-16 ENCOUNTER — Encounter (HOSPITAL_COMMUNITY): Payer: Self-pay | Admitting: Surgery

## 2020-10-16 DIAGNOSIS — Z48812 Encounter for surgical aftercare following surgery on the circulatory system: Secondary | ICD-10-CM | POA: Diagnosis not present

## 2020-10-17 ENCOUNTER — Telehealth (HOSPITAL_COMMUNITY): Payer: Self-pay

## 2020-10-17 NOTE — Telephone Encounter (Signed)
Per phase I cardiac rehab, fax cardiac rehab referral to [facility]. °

## 2020-10-24 ENCOUNTER — Ambulatory Visit (INDEPENDENT_AMBULATORY_CARE_PROVIDER_SITE_OTHER): Payer: Self-pay | Admitting: *Deleted

## 2020-10-24 ENCOUNTER — Other Ambulatory Visit: Payer: Self-pay

## 2020-10-24 DIAGNOSIS — Z4802 Encounter for removal of sutures: Secondary | ICD-10-CM

## 2020-10-24 NOTE — Progress Notes (Signed)
Patient arrived for nurse visit to remove sutures post-AVR 8/19 by Dr. Laneta Simmers.  Two sutures removed with no signs or symptoms of infection noted.  Patient tolerated suture removal well.  Patient and family instructed to keep the incision site clean and dry. Patient and family acknowledged instructions given.  All questions answered.

## 2020-10-29 NOTE — Progress Notes (Signed)
Cardiology Office Note:    Date:  10/30/2020   ID:  Robert Li, DOB 1955/04/02, MRN [id number]  PCP:  Noni Saupe, MD  Cardiologist:  Norman Herrlich, MD    Referring MD: Noni Saupe, MD    ASSESSMENT:    1. S/P AVR (aortic valve replacement)   2. High coronary artery calcium score   3. Coronary artery disease of native artery of native heart with stable angina pectoris (HCC)   4. Hypertensive heart disease without heart failure   5. Hypercholesterolemia   6. SOB (shortness of breath) on exertion    PLAN:    In order of problems listed above:  He has had a marked improvement after surgical aortic valve replacement we will initiate cardiac rehabilitation optimize lipid-lowering with a very high calcium score nonobstructive CAD and check baseline signature echocardiogram for future reference.  Reduce aspirin 81 mg in 90 days BP at target continue current treatment beta-blocker Recheck lipid profile LP(a) likely need optimization of lipid-lowering therapy   Next appointment: 3 months   Medication Adjustments/Labs and Tests Ordered: Current medicines are reviewed at length with the patient today.  Concerns regarding medicines are outlined above.  No orders of the defined types were placed in this encounter.  No orders of the defined types were placed in this encounter.   Chief Complaint  Patient presents with   Follow-up     History of Present Illness:    Robert Li is a 65 y.o. male with a hx of severe symptomatic aortic stenosis with peak and mean gradients preoperative with a 126 and 87 mmHg CAD hypertension and hyperlipidemia last seen 08/19/2020 preceding surgical aortic valve replacement..  Compliance with diet, lifestyle and medications: Yes  Is slowly and steadily improved his only real complaint is he still has a sore throat from intubation and reintubation. Will be starting cardiac rehabilitation has no edema palpitation chest pain  shortness of breath or wound drainage. .  Very high calcium score we will continue high intensity statin recheck his lipid profile and LP(a) likely needs intensification of lipid-lowering treatment He will have his baseline postoperative echo performed  He is subsequently underwent coronary angiography and cardiothoracic surgery with bioprosthetic AVR number 23 mm Inspriris Resilia pericardial valve by Dr. Lavinia Sharps 10/10/2020 coronary angiography showed mild nonobstructive CAD normal LV systolic function EF 60 to 65% with severe LVH.  His postoperative course he had postoperative unresponsiveness was reintubated and was evaluated by neurology with no finding of stroke.  He also had volume overload and required diuresis he did not require postoperative transfusion.  Subsequently he was ambulated , wounds were clean dry without signs of infection epicardial pacing wires removed 10/14/2020. Past Medical History:  Diagnosis Date   Benign essential hypertension    Diabetes mellitus with renal complications (HCC)    History of alcohol abuse    Hypercholesterolemia    Onychomycosis    Severe aortic stenosis    Tobacco abuse     Past Surgical History:  Procedure Laterality Date   AORTIC VALVE REPLACEMENT N/A 10/10/2020   Procedure: AORTIC VALVE REPLACEMENT (AVR) WITH INSPIRIS AORTIC VALVE;  Surgeon: [name], MD;  Location: [facility] OR;  Service: Open Heart Surgery;  Laterality: N/A;   NO PAST SURGERIES     RIGHT HEART CATH AND CORONARY/GRAFT ANGIOGRAPHY N/A 09/01/2020   Procedure: RIGHT HEART CATH AND CORONARY/GRAFT ANGIOGRAPHY;  Surgeon: Kathleene Hazel, MD;  Location: [facility] INVASIVE CV LAB;  Service: Cardiovascular;  Laterality: N/A;   TEE WITHOUT CARDIOVERSION N/A 10/10/2020   Procedure: TRANSESOPHAGEAL ECHOCARDIOGRAM (TEE);  Surgeon: [name], MD;  [facility]: [facility];  Service: Open Heart Surgery;  Laterality: N/A;    Current Medications: Current Meds  Medication Sig   aspirin EC  325 MG EC tablet Take 1 tablet (325 mg total) by mouth daily.   CINNAMON PO Take 1,000 mg by mouth in the morning.   JARDIANCE 10 MG TABS tablet Take 10 mg by mouth in the morning.   Melatonin 10 MG TABS Take 10 mg by mouth at bedtime.   metFORMIN (GLUCOPHAGE) 500 MG tablet Take 500 mg by mouth 2 (two) times daily.   metoprolol tartrate (LOPRESSOR) 25 MG tablet Take 1 tablet (25 mg total) by mouth 2 (two) times daily.   Multiple Vitamin (MULTIVITAMIN WITH MINERALS) TABS tablet Take 1 tablet by mouth in the morning.   oxyCODONE (OXY IR/ROXICODONE) 5 MG immediate release tablet Take 1 tablet (5 mg total) by mouth every 6 (six) hours as needed for severe pain.   rosuvastatin (CRESTOR) 5 MG tablet Take 5 mg by mouth at bedtime.   triamcinolone cream (KENALOG) 0.1 % Apply 1 application topically 2 (two) times daily as needed (red skin/irritation).     Allergies:   Altace [ramipril], Hctz [hydrochlorothiazide], and Wellbutrin [bupropion]   Social History   Socioeconomic History   Marital status: Unknown    Spouse name: Not on file   Number of children: Not on file   Years of education: Not on file   Highest education level: Not on file  Occupational History   Not on file  Tobacco Use   Smoking status: Former    Packs/day: 1.00    Types: Cigarettes    Quit date: 10/10/2020    Years since quitting: 0.0   Smokeless tobacco: Never  Vaping Use   Vaping Use: Never used  Substance and Sexual Activity   Alcohol use: Yes    Alcohol/week: 35.0 standard drinks    Types: 35 Cans of beer per week   Drug use: Yes    Types: Marijuana    Comment: every day   Sexual activity: Not on file  Other Topics Concern   Not on file  Social History Narrative   Not on file   Social Determinants of Health   Financial Resource Strain: Not on file  Food Insecurity: Not on file  Transportation Needs: Not on file  Physical Activity: Not on file  Stress: Not on file  Social Connections: Not on file      Family History: The patient's family history includes Alzheimer's disease in his mother; Colon cancer in his father; Diabetes in his father; Hypertension in his father. ROS:   Please see the history of present illness.    All other systems reviewed and are negative.  EKGs/Labs/Other Studies Reviewed:    The following studies were reviewed today:    Recent Labs: 10/10/2020: ALT 49 10/11/2020: Magnesium 2.1 10/13/2020: BUN 11; Creatinine, Ser 0.86; Hemoglobin 11.7; Platelets 86; Potassium 4.3; Sodium 134  Recent Lipid Panel No results found for: CHOL, TRIG, HDL, CHOLHDL, VLDL, LDLCALC, LDLDIRECT  Physical Exam:    VS:  BP 130/82 (BP Location: Right Arm, Patient Position: Sitting, Cuff Size: Normal)   Pulse 80   Ht 5\' 6"  (1.676 m)   Wt 135 lb (61.2 kg)   SpO2 97%   BMI 21.79 kg/m     Wt Readings from Last 3 Encounters:  10/30/20 135 lb (61.2 kg)  10/15/20 138 lb 7.2 oz (62.8 kg)  10/08/20 140 lb 2 oz (63.6 kg)     GEN:  Well nourished, well developed in no acute distress HEENT: Normal wounds are all healed NECK: No JVD; No carotid bruits LYMPHATICS: No lymphadenopathy CARDIAC: RRR, no murmurs, rubs, gallops RESPIRATORY:  Clear to auscultation without rales, wheezing or rhonchi  ABDOMEN: Soft, non-tender, non-distended MUSCULOSKELETAL:  No edema; No deformity  SKIN: Warm and dry NEUROLOGIC:  Alert and oriented x 3 PSYCHIATRIC:  Normal affect    Signed, Norman Herrlich, MD  10/30/2020 2:11 PM    [facility]

## 2020-10-30 ENCOUNTER — Ambulatory Visit (INDEPENDENT_AMBULATORY_CARE_PROVIDER_SITE_OTHER): Payer: Medicare Other | Admitting: Cardiology

## 2020-10-30 ENCOUNTER — Other Ambulatory Visit: Payer: Self-pay

## 2020-10-30 ENCOUNTER — Encounter: Payer: Self-pay | Admitting: Cardiology

## 2020-10-30 VITALS — BP 130/82 | HR 80 | Ht 66.0 in | Wt 135.0 lb

## 2020-10-30 DIAGNOSIS — I25118 Atherosclerotic heart disease of native coronary artery with other forms of angina pectoris: Secondary | ICD-10-CM

## 2020-10-30 DIAGNOSIS — I119 Hypertensive heart disease without heart failure: Secondary | ICD-10-CM | POA: Diagnosis not present

## 2020-10-30 DIAGNOSIS — R931 Abnormal findings on diagnostic imaging of heart and coronary circulation: Secondary | ICD-10-CM

## 2020-10-30 DIAGNOSIS — Z952 Presence of prosthetic heart valve: Secondary | ICD-10-CM | POA: Diagnosis not present

## 2020-10-30 DIAGNOSIS — E7800 Pure hypercholesterolemia, unspecified: Secondary | ICD-10-CM

## 2020-10-30 MED ORDER — ASPIRIN EC 81 MG PO TBEC: 81.0000 mg | DELAYED_RELEASE_TABLET | Freq: Every day | ORAL | 3 refills | Status: DC

## 2020-10-30 NOTE — Patient Instructions (Signed)
Medication Instructions:  Your physician has recommended you make the following change in your medication:  DECREASE: Aspirin 81 mg take one tablet by mouth daily.  *If you need a refill on your cardiac medications before your next appointment, please call your pharmacy*   Lab Work: Your physician recommends that you return for lab work in: TODAY Lipids, Lpa If you have labs (blood work) drawn today and your tests are completely normal, you will receive your results only by: MyChart Message (if you have MyChart) OR A paper copy in the mail If you have any lab test that is abnormal or we need to change your treatment, we will call you to review the results.   Testing/Procedures: Your physician has requested that you have an echocardiogram. Echocardiography is a painless test that uses sound waves to create images of your heart. It provides your doctor with information about the size and shape of your heart and how well your heart's chambers and valves are working. This procedure takes approximately one hour. There are no restrictions for this procedure.    Follow-Up: At [facility], you and your health needs are our priority.  As part of our continuing mission to provide you with exceptional heart care, we have created designated Provider Care Teams.  These Care Teams include your primary Cardiologist (physician) and Advanced Practice Providers (APPs -  Physician Assistants and Nurse Practitioners) who all work together to provide you with the care you need, when you need it.  We recommend signing up for the patient portal called "MyChart".  Sign up information is provided on this After Visit Summary.  MyChart is used to connect with patients for Virtual Visits (Telemedicine).  Patients are able to view lab/test results, encounter notes, upcoming appointments, etc.  Non-urgent messages can be sent to your provider as well.   To learn more about what you can do with MyChart, go to  [email or web address].    Your next appointment:   3 month(s)  The format for your next appointment:   In Person  Provider:   Norman Herrlich, MD   Other Instructions

## 2020-10-31 ENCOUNTER — Telehealth: Payer: Self-pay

## 2020-10-31 LAB — LIPID PANEL
Chol/HDL Ratio: 3.5 ratio (ref 0.0–5.0)
Cholesterol, Total: 133 mg/dL (ref 100–199)
HDL: 38 mg/dL — ABNORMAL LOW (ref >39)
LDL Chol Calc (NIH): 34 mg/dL (ref 0–99)
Triglycerides: 428 mg/dL — ABNORMAL HIGH (ref 0–149)
VLDL Cholesterol Cal: 61 mg/dL — ABNORMAL HIGH (ref 5–40)

## 2020-10-31 LAB — LIPOPROTEIN A (LPA): Lipoprotein (a): 16.3 nmol/L (ref <75.0)

## 2020-10-31 NOTE — Telephone Encounter (Signed)
Patient notified of results and recommendations.

## 2020-10-31 NOTE — Telephone Encounter (Signed)
-----   Message from Baldo Daub, MD sent at 10/31/2020 12:27 PM EDT ----- Normal or stable result  Good result note his lipids are ideal continue his statin

## 2020-11-06 ENCOUNTER — Other Ambulatory Visit: Payer: Self-pay | Admitting: Physician Assistant

## 2020-11-11 ENCOUNTER — Other Ambulatory Visit: Payer: Self-pay | Admitting: Surgery

## 2020-11-11 DIAGNOSIS — Z953 Presence of xenogenic heart valve: Secondary | ICD-10-CM

## 2020-11-12 ENCOUNTER — Other Ambulatory Visit: Payer: Self-pay

## 2020-11-12 ENCOUNTER — Ambulatory Visit (INDEPENDENT_AMBULATORY_CARE_PROVIDER_SITE_OTHER): Payer: Self-pay | Admitting: Surgery

## 2020-11-12 ENCOUNTER — Ambulatory Visit
Admission: RE | Admit: 2020-11-12 | Discharge: 2020-11-12 | Disposition: A | Discharge status: Home / Self Care | Payer: Medicare Other | Source: Ambulatory Visit | Attending: Surgery | Admitting: Surgery

## 2020-11-12 ENCOUNTER — Encounter: Payer: Self-pay | Admitting: Surgery

## 2020-11-12 VITALS — BP 132/87 | HR 62 | Resp 20 | Ht 66.0 in | Wt 134.0 lb

## 2020-11-12 DIAGNOSIS — Z953 Presence of xenogenic heart valve: Secondary | ICD-10-CM

## 2020-11-12 NOTE — Progress Notes (Signed)
HPI: Patient returns for routine postoperative follow-up having undergone aortic valve replacement using a 23 mm INSPIRIS pericardial valve on 10/10/2020. The patient's early postoperative recovery while in the hospital was notable for development of sudden unresponsiveness and respiratory arrest shortly after being extubated.  He required reintubation.  A code stroke was called and CT of the head and CTA of the head and neck were negative for abnormality.  His neurologic status returned to normal fairly quickly and he was extubated the next morning without incident.  He remained neurologically intact.  I felt this is most likely due to an air embolism from his left heart when he sat up in bed after extubation. Since hospital discharge the patient reports that he has been feeling well.  He is walking daily without chest pain or shortness of breath.  He has already returned to driving some.  He continues to abstain from smoking.   Current Outpatient Medications  Medication Sig Dispense Refill   aspirin 325 MG tablet Take 325 mg by mouth daily.     CINNAMON PO Take 1,000 mg by mouth in the morning. Currently taking 1200     JARDIANCE 10 MG TABS tablet Take 10 mg by mouth in the morning.     Melatonin 10 MG TABS Take 10 mg by mouth at bedtime.     metFORMIN (GLUCOPHAGE) 500 MG tablet Take 500 mg by mouth 2 (two) times daily.     metoprolol tartrate (LOPRESSOR) 25 MG tablet Take 1 tablet (25 mg total) by mouth 2 (two) times daily. 60 tablet 1   Multiple Vitamin (MULTIVITAMIN WITH MINERALS) TABS tablet Take 1 tablet by mouth in the morning.     rosuvastatin (CRESTOR) 5 MG tablet Take 5 mg by mouth at bedtime.     triamcinolone cream (KENALOG) 0.1 % Apply 1 application topically 2 (two) times daily as needed (red skin/irritation).     No current facility-administered medications for this visit.    Physical Exam: BP 132/87 (BP Location: Right Arm, Patient Position: Sitting)   Pulse 62   Resp 20    Ht 5\' 6"  (1.676 m)   Wt 134 lb (60.8 kg)   SpO2 100% Comment: RA  BMI 21.63 kg/m  He looks well. Cardiac exam shows a regular rate and rhythm with normal heart sounds.  There is no murmur. Lungs are clear. Chest incision is healing well and the sternum is stable.  There is a small suture sticking out of the middle of the incision and this was removed. There is no peripheral edema.  Diagnostic Tests:  Narrative & Impression  CLINICAL DATA:  Status post aortic valve repair.   EXAM: CHEST - 2 VIEW   COMPARISON:  October 13, 2020.   FINDINGS: The heart size and mediastinal contours are within normal limits. Both lungs are clear. The visualized skeletal structures are unremarkable.   IMPRESSION: No active cardiopulmonary disease.     Electronically Signed   By: October 15, 2020 M.D.   On: 11/12/2020 09:23      Impression:  He is doing well 1 month following his surgery.  I told him that he can return to driving a car and may do light work around his house as long as he is not lifting anything heavier than 10 pounds for 3 months postoperatively.  He will continue aspirin 325 mg daily for the first 3 months and then decrease that to 81 mg daily.  Plan:  He will continue to follow-up  with his PCP and Dr. Dulce Sellar and will return to see me if he has any problems with his incisions.   [name], MD [facility] [phone]

## 2020-11-13 ENCOUNTER — Other Ambulatory Visit: Payer: Medicare Other

## 2020-11-24 ENCOUNTER — Other Ambulatory Visit: Payer: Medicare Other

## 2020-12-02 ENCOUNTER — Ambulatory Visit (INDEPENDENT_AMBULATORY_CARE_PROVIDER_SITE_OTHER): Payer: Medicare Other

## 2020-12-02 ENCOUNTER — Telehealth: Payer: Self-pay

## 2020-12-02 ENCOUNTER — Other Ambulatory Visit: Payer: Self-pay

## 2020-12-02 DIAGNOSIS — Z952 Presence of prosthetic heart valve: Secondary | ICD-10-CM | POA: Diagnosis not present

## 2020-12-02 LAB — ECHOCARDIOGRAM COMPLETE
AR max vel: 1.06 cm2
AV Area VTI: 1.12 cm2
AV Area mean vel: 0.99 cm2
AV Mean grad: 9.3 mmHg
AV Peak grad: 17.2 mmHg
Ao pk vel: 2.07 m/s
Area-P 1/2: 2.6 cm2
Calc EF: 46.1 %
S' Lateral: 2.75 cm
Single Plane A2C EF: 42.9 %
Single Plane A4C EF: 48.9 %

## 2020-12-02 NOTE — Telephone Encounter (Signed)
Spoke with patient regarding results and recommendation.  Patient verbalizes understanding and is agreeable to plan of care. Advised patient to call back with any issues or concerns.  

## 2020-12-02 NOTE — Telephone Encounter (Signed)
-----   Message from Baldo Daub, MD sent at 12/02/2020  1:59 PM EDT ----- This is a early postoperative baseline echo to use in the future for comparison  Valve function is normal  Heart muscle function is mildly reduced not uncommon soon after valve surgery and generally improves over time

## 2020-12-07 ENCOUNTER — Other Ambulatory Visit: Payer: Self-pay | Admitting: Physician Assistant

## 2020-12-08 ENCOUNTER — Other Ambulatory Visit: Payer: Self-pay | Admitting: Cardiology

## 2021-02-01 NOTE — Progress Notes (Signed)
Cardiology Office Note:    Date:  02/02/2021   ID:  Robert Li, Robert Li 12-26-1955, MRN [id number]  PCP:  Noni Saupe, MD  Cardiologist:  Norman Herrlich, MD    Referring MD: Noni Saupe, MD    ASSESSMENT:    1. S/P AVR (aortic valve replacement)   2. Coronary artery disease of native artery of native heart with stable angina pectoris (HCC)   3. Hypertensive heart disease without heart failure   4. Hypercholesterolemia    PLAN:    In order of problems listed above:  He has made a full recovery following AVR for critical aortic stenosis New York Heart Association class I reinforced the need for endocarditis prophylaxis for dental visits see back in office in 1 year Stable New York Heart Association class I nonobstructive angiography having no angina continue medical treatment including aspirin beta-blocker Intensity statin Will trend blood pressures for 2 weeks and bring list to the office I suspect as well readings will be 5-10 last systolic will be at target additional therapy as needed ARB Lipids are ideal continue high intensity statin   Next appointment: 1 year   Medication Adjustments/Labs and Tests Ordered: Current medicines are reviewed at length with the patient today.  Concerns regarding medicines are outlined above.  No orders of the defined types were placed in this encounter.  No orders of the defined types were placed in this encounter.   Chief Complaint  Patient presents with   Follow-up    After AVR    [facility]  History of Present Illness:    Robert Li is a 65 y.o. male with a hx of severe symptomatic aortic stenosis with peak and mean gradients of 126/87 mmHg nonobstructive CAD hypertension with severe LVH and hyper lipidemia and subsequent surgical AVR 10/10/2020 last seen 10/30/2020.  Compliance with diet, lifestyle and medications: Yes  He has made a full complete recovery from AVR and is taking low-dose aspirin more  than 90 days postoperatively Is not checking his blood pressure at home He has had no cardiovascular symptoms of edema shortness of breath orthopnea chest pain palpitations or syncope. He tolerates his statin without muscle pain or weakness Recent labs 12/23/2020 cholesterol 144 LDL 34 HDL 61 triglycerides 1 6088 A1c 6.3% Past Medical History:  Diagnosis Date   Benign essential hypertension    Diabetes mellitus with renal complications (HCC)    History of alcohol abuse    Hypercholesterolemia    Onychomycosis    Severe aortic stenosis    Tobacco abuse     Past Surgical History:  Procedure Laterality Date   AORTIC VALVE REPLACEMENT N/A 10/10/2020   Procedure: AORTIC VALVE REPLACEMENT (AVR) WITH INSPIRIS AORTIC VALVE;  Surgeon: [name], MD;  Location: [facility] OR;  Service: Open Heart Surgery;  Laterality: N/A;   NO PAST SURGERIES     RIGHT HEART CATH AND CORONARY/GRAFT ANGIOGRAPHY N/A 09/01/2020   Procedure: RIGHT HEART CATH AND CORONARY/GRAFT ANGIOGRAPHY;  Surgeon: Kathleene Hazel, MD;  Location: [facility] INVASIVE CV LAB;  Service: Cardiovascular;  Laterality: N/A;   TEE WITHOUT CARDIOVERSION N/A 10/10/2020   Procedure: TRANSESOPHAGEAL ECHOCARDIOGRAM (TEE);  Surgeon: [name], MD;  Location: [facility] OR;  Service: Open Heart Surgery;  Laterality: N/A;    Current Medications: Current Meds  Medication Sig   aspirin 325 MG tablet Take 325 mg by mouth daily.   CINNAMON PO Take 1,000 mg by mouth in the morning. Currently taking 1200  JARDIANCE 10 MG TABS tablet Take 10 mg by mouth in the morning.   Melatonin 10 MG TABS Take 10 mg by mouth at bedtime.   metFORMIN (GLUCOPHAGE) 500 MG tablet Take 500 mg by mouth 2 (two) times daily.   metoprolol tartrate (LOPRESSOR) 25 MG tablet TAKE 1 TABLET BY MOUTH TWICE A DAY   Multiple Vitamin (MULTIVITAMIN WITH MINERALS) TABS tablet Take 1 tablet by mouth in the morning.   rosuvastatin (CRESTOR) 5 MG tablet Take 5 mg by mouth at bedtime.    triamcinolone cream (KENALOG) 0.1 % Apply 1 application topically 2 (two) times daily as needed (red skin/irritation).     Allergies:   Altace [ramipril], Hctz [hydrochlorothiazide], and Wellbutrin [bupropion]   Social History   Socioeconomic History   Marital status: Unknown    Spouse name: Not on file   Number of children: Not on file   Years of education: Not on file   Highest education level: Not on file  Occupational History   Not on file  Tobacco Use   Smoking status: Former    Packs/day: 1.00    Types: Cigarettes    Quit date: 10/10/2020    Years since quitting: 0.3   Smokeless tobacco: Never  Vaping Use   Vaping Use: Never used  Substance and Sexual Activity   Alcohol use: Yes    Alcohol/week: 35.0 standard drinks    Types: 35 Cans of beer per week   Drug use: Yes    Types: Marijuana    Comment: every day   Sexual activity: Not on file  Other Topics Concern   Not on file  Social History Narrative   Not on file   Social Determinants of Health   Financial Resource Strain: Not on file  Food Insecurity: Not on file  Transportation Needs: Not on file  Physical Activity: Not on file  Stress: Not on file  Social Connections: Not on file     Family History: The patient's family history includes Alzheimer's disease in his mother; Colon cancer in his father; Diabetes in his father; Hypertension in his father. ROS:   Please see the history of present illness.    All other systems reviewed and are negative.  EKGs/Labs/Other Studies Reviewed:    The following studies were reviewed today:    Recent Labs: 10/10/2020: ALT 49 10/11/2020: Magnesium 2.1 10/13/2020: BUN 11; Creatinine, Ser 0.86; Hemoglobin 11.7; Platelets 86; Potassium 4.3; Sodium 134  Recent Lipid Panel    Component Value Date/Time   CHOL 133 10/30/2020 1432   TRIG 428 (H) 10/30/2020 1432   HDL 38 (L) 10/30/2020 1432   CHOLHDL 3.5 10/30/2020 1432   LDLCALC 34 10/30/2020 1432    Physical Exam:     VS:  BP (!) 154/64   Pulse (!) 54   Ht 5\' 6"  (1.676 m)   Wt 143 lb 12.8 oz (65.2 kg)   SpO2 98%   BMI 23.21 kg/m     Wt Readings from Last 3 Encounters:  02/02/21 143 lb 12.8 oz (65.2 kg)  11/12/20 134 lb (60.8 kg)  10/30/20 135 lb (61.2 kg)    Repeat 142/70 right upper extremity GEN:  Well nourished, well developed in no acute distress HEENT: Normal NECK: No JVD; No carotid bruits LYMPHATICS: No lymphadenopathy CARDIAC: Minimal 1/6 systolic ejection murmur aortic area no aortic regurgitation RRR, no rubs, gallops RESPIRATORY:  Clear to auscultation without rales, wheezing or rhonchi  ABDOMEN: Soft, non-tender, non-distended MUSCULOSKELETAL:  No edema; No deformity  SKIN: Warm and dry NEUROLOGIC:  Alert and oriented x 3 PSYCHIATRIC:  Normal affect    Signed, Norman Herrlich, MD  02/02/2021 11:00 AM    [facility]

## 2021-02-02 ENCOUNTER — Other Ambulatory Visit: Payer: Self-pay

## 2021-02-02 ENCOUNTER — Encounter: Payer: Self-pay | Admitting: Cardiology

## 2021-02-02 ENCOUNTER — Ambulatory Visit (INDEPENDENT_AMBULATORY_CARE_PROVIDER_SITE_OTHER): Payer: Medicare Other | Admitting: Cardiology

## 2021-02-02 VITALS — BP 154/64 | HR 54 | Ht 66.0 in | Wt 143.8 lb

## 2021-02-02 DIAGNOSIS — I119 Hypertensive heart disease without heart failure: Secondary | ICD-10-CM | POA: Diagnosis not present

## 2021-02-02 DIAGNOSIS — E7800 Pure hypercholesterolemia, unspecified: Secondary | ICD-10-CM

## 2021-02-02 DIAGNOSIS — I25118 Atherosclerotic heart disease of native coronary artery with other forms of angina pectoris: Secondary | ICD-10-CM | POA: Diagnosis not present

## 2021-02-02 DIAGNOSIS — Z952 Presence of prosthetic heart valve: Secondary | ICD-10-CM | POA: Diagnosis not present

## 2021-02-02 MED ORDER — AMOXICILLIN 500 MG PO CAPS: ORAL_CAPSULE | ORAL | 2 refills | Status: AC

## 2021-02-02 NOTE — Patient Instructions (Addendum)
[facility]  Tips to measure your blood pressure correctly  To determine whether you have hypertension, a medical professional will take a blood pressure reading. How you prepare for the test, the position of your arm, and other factors can change a blood pressure reading by 10% or more. That could be enough to hide high blood pressure, start you on a drug you don't really need, or lead your doctor to incorrectly adjust your medications. National and international guidelines offer specific instructions for measuring blood pressure. If a doctor, nurse, or medical assistant isn't doing it right, don't hesitate to ask him or her to get with the guidelines. Here's what you can do to ensure a correct reading:  Don't drink a caffeinated beverage or smoke during the 30 minutes before the test.  Sit quietly for five minutes before the test begins.  During the measurement, sit in a chair with your feet on the floor and your arm supported so your elbow is at about heart level.  The inflatable part of the cuff should completely cover at least 80% of your upper arm, and the cuff should be placed on bare skin, not over a shirt.  Don't talk during the measurement.  Have your blood pressure measured twice, with a brief break in between. If the readings are different by 5 points or more, have it done a third time. There are times to break these rules. If you sometimes feel lightheaded when getting out of bed in the morning or when you stand after sitting, you should have your blood pressure checked while seated and then while standing to see if it falls from one position to the next. Because blood pressure varies throughout the day, your doctor will rarely diagnose hypertension on the basis of a single reading. Instead, he or she will want to confirm the measurements on at least two occasions, usually within a few weeks of one another. The exception to this rule is if you have a blood pressure reading of 180/110 mm Hg  or higher. A result this high usually calls for prompt treatment. It's also a good idea to have your blood pressure measured in both arms at least once, since the reading in one arm (usually the right) may be higher than that in the left. A 2014 study in The American Journal of Medicine of nearly 3,400 people found average arm- to-arm differences in systolic blood pressure of about 5 points. The higher number should be used to make treatment decisions. In 2017, new guidelines from the [place], the [employer] of Cardiology, and nine other health organizations lowered the diagnosis of high blood pressure to 130/80 mm Hg or higher for all adults. The guidelines also redefined the various blood pressure categories to now include normal, elevated, Stage 1 hypertension, Stage 2 hypertension, and hypertensive crisis (see "Blood pressure categories"). Blood pressure categories  Blood pressure category SYSTOLIC (upper number)  DIASTOLIC (lower number)  Normal Less than 120 mm Hg and Less than 80 mm Hg  Elevated 120-129 mm Hg and Less than 80 mm Hg  High blood pressure: Stage 1 hypertension 130-139 mm Hg or 80-89 mm Hg  High blood pressure: Stage 2 hypertension 140 mm Hg or higher or 90 mm Hg or higher  Hypertensive crisis (consult your doctor immediately) Higher than 180 mm Hg and/or Higher than 120 mm Hg  Source: American Heart Association and American Stroke Association. For more on getting your blood pressure under control, buy Controlling Your Blood  Pressure, a Special Health Report from [facility].   Medication Instructions:  Your physician has recommended you make the following change in your medication:   Take Amoxicillin 2 gm 45 minutes prior to any dental visits.  *If you need a refill on your cardiac medications before your next appointment, please call your pharmacy*   Lab Work: None ordered If you have labs (blood work) drawn today and your tests are  completely normal, you will receive your results only by: MyChart Message (if you have MyChart) OR A paper copy in the mail If you have any lab test that is abnormal or we need to change your treatment, we will call you to review the results.   Testing/Procedures: None ordered   Follow-Up: At [facility], you and your health needs are our priority.  As part of our continuing mission to provide you with exceptional heart care, we have created designated Provider Care Teams.  These Care Teams include your primary Cardiologist (physician) and Advanced Practice Providers (APPs -  Physician Assistants and Nurse Practitioners) who all work together to provide you with the care you need, when you need it.  We recommend signing up for the patient portal called "MyChart".  Sign up information is provided on this After Visit Summary.  MyChart is used to connect with patients for Virtual Visits (Telemedicine).  Patients are able to view lab/test results, encounter notes, upcoming appointments, etc.  Non-urgent messages can be sent to your provider as well.   To learn more about what you can do with MyChart, go to [email or web address].    Your next appointment:   12 month(s)  The format for your next appointment:   In Person  Provider:   Norman Herrlich, MD   Other Instructions   Blood Pressure Record Sheet To take your blood pressure, you will need a blood pressure machine. You can buy a blood pressure machine (blood pressure monitor) at your clinic, drug store, or online. When choosing one, consider: An automatic monitor that has an arm cuff. A cuff that wraps snugly around your upper arm. You should be able to fit only one finger between your arm and the cuff. A device that stores blood pressure reading results. Do not choose a monitor that measures your blood pressure from your wrist or finger. Follow your health care provider's instructions for how to take your blood pressure. To use  this form: Get one reading in the morning (a.m.) 1-2 hours after you take any medicines. Get one reading in the evening (p.m.) before supper. Take at least 2 readings with each blood pressure check. This makes sure the results are correct. Wait 1-2 minutes between measurements. Write down the results in the spaces on this form. Repeat this once a week, or as told by your health care provider.  Make a follow-up appointment with your health care provider to discuss the results. Blood pressure log Date: _______________________ a.m. _____________________(1st reading) HR___________            p.m. _____________________(2nd reading) HR__________  Date: _______________________ a.m. _____________________(1st reading) HR___________            p.m. _____________________(2nd reading) HR__________ Date: _______________________ a.m. _____________________(1st reading) HR___________            p.m. _____________________(2nd reading) HR__________ Date: _______________________ a.m. _____________________(1st reading) HR___________            p.m. _____________________(2nd reading) HR__________  Date: _______________________ a.m. _____________________(1st reading) HR___________  p.m. _____________________(2nd reading) HR__________  Date: _______________________ a.m. _____________________(1st reading) HR___________            p.m. _____________________(2nd reading) HR__________  Date: _______________________ a.m. _____________________(1st reading) HR___________            p.m. _____________________(2nd reading) HR__________   This information is not intended to replace advice given to you by your health care provider. Make sure you discuss any questions you have with your health care provider. Document Revised: 05/30/2019 Document Reviewed: 05/30/2019 Elsevier Patient Education  2021 [employer].

## 2021-08-17 ENCOUNTER — Telehealth: Payer: Self-pay

## 2021-08-19 NOTE — Telephone Encounter (Signed)
   Name: Robert Li  DOB: August 23, 1955  MRN: [id number]   Primary Cardiologist: Norman Herrlich, MD  Chart reviewed as part of pre-operative protocol coverage. We have been asked to hold ASA and about SBE PPX for colonoscopy.  We do not recommend SBE PPX for valve patients undergoing colonoscopy. In addition, we typically do not recommend holding ASA for colonoscopy.  I will route this recommendation to the requesting party via Epic fax function and remove from pre-op pool. Please call with questions.  Marcelino Duster, PA 08/19/2021, 3:51 PM

## 2021-08-19 NOTE — Telephone Encounter (Signed)
CORRECTION ON CLEARANCE REQUEST:   DR. Jennye Boroughs WILL BE DOING COLONOSCOPY  DR. Jennye Boroughs; DUE TO PT HAS HAD VALVE REPLACEMENT, DOES THE PT NEEDS SBE  I assured Clydie Braun I will have pre op provider re-assess. Once cleared we will fax clearance notes .

## 2021-08-19 NOTE — Telephone Encounter (Signed)
I have left a message for the requesting office to please call back as we are needing to clarify some of the information that was given.

## 2021-09-10 ENCOUNTER — Other Ambulatory Visit: Payer: Self-pay | Admitting: Cardiology

## 2021-12-11 ENCOUNTER — Other Ambulatory Visit: Payer: Self-pay | Admitting: Cardiology

## 2021-12-11 NOTE — Telephone Encounter (Signed)
Rx refill sent to pharmacy. 

## 2022-01-28 ENCOUNTER — Ambulatory Visit: Payer: Medicare Other | Attending: Cardiology | Admitting: Cardiology

## 2022-01-28 ENCOUNTER — Encounter: Payer: Self-pay | Admitting: Cardiology

## 2022-01-28 VITALS — BP 124/78 | HR 60 | Ht 65.0 in | Wt 149.0 lb

## 2022-01-28 DIAGNOSIS — E7800 Pure hypercholesterolemia, unspecified: Secondary | ICD-10-CM

## 2022-01-28 DIAGNOSIS — I25118 Atherosclerotic heart disease of native coronary artery with other forms of angina pectoris: Secondary | ICD-10-CM | POA: Diagnosis not present

## 2022-01-28 DIAGNOSIS — Z952 Presence of prosthetic heart valve: Secondary | ICD-10-CM | POA: Diagnosis not present

## 2022-01-28 DIAGNOSIS — I119 Hypertensive heart disease without heart failure: Secondary | ICD-10-CM | POA: Diagnosis not present

## 2022-01-28 NOTE — Patient Instructions (Signed)
Medication Instructions:  Your physician recommends that you continue on your current medications as directed. Please refer to the Current Medication list given to you today.  *If you need a refill on your cardiac medications before your next appointment, please call your pharmacy*   Lab Work: None If you have labs (blood work) drawn today and your tests are completely normal, you will receive your results only by: MyChart Message (if you have MyChart) OR A paper copy in the mail If you have any lab test that is abnormal or we need to change your treatment, we will call you to review the results.   Testing/Procedures: Your physician has requested that you have an echocardiogram. Echocardiography is a painless test that uses sound waves to create images of your heart. It provides your doctor with information about the size and shape of your heart and how well your heart's chambers and valves are working. This procedure takes approximately one hour. There are no restrictions for this procedure. Please do NOT wear cologne, perfume, aftershave, or lotions (deodorant is allowed). Please arrive 15 minutes prior to your appointment time.    Follow-Up: At [facility], you and your health needs are our priority.  As part of our continuing mission to provide you with exceptional heart care, we have created designated Provider Care Teams.  These Care Teams include your primary Cardiologist (physician) and Advanced Practice Providers (APPs -  Physician Assistants and Nurse Practitioners) who all work together to provide you with the care you need, when you need it.  We recommend signing up for the patient portal called "MyChart".  Sign up information is provided on this After Visit Summary.  MyChart is used to connect with patients for Virtual Visits (Telemedicine).  Patients are able to view lab/test results, encounter notes, upcoming appointments, etc.  Non-urgent messages can be sent to your  provider as well.   To learn more about what you can do with MyChart, go to [email or web address].    Your next appointment:   1 year(s)  The format for your next appointment:   In Person  Provider:   Norman Herrlich, MD    Other Instructions Goal activity of 30 minutes per day  Important Information About Sugar

## 2022-01-28 NOTE — Progress Notes (Signed)
Cardiology Office Note:    Date:  01/28/2022   ID:  Robert, Li 1955/12/17, MRN [id number]  PCP:  Noni Saupe, MD  Cardiologist:  Norman Herrlich, MD    Referring MD: Noni Saupe, MD    ASSESSMENT:    1. S/P AVR (aortic valve replacement)   2. Coronary artery disease of native artery of native heart with stable angina pectoris (HCC)   3. Hypertensive heart disease without heart failure   4. Hypercholesterolemia    PLAN:    In order of problems listed above:  Robert Li has done well after his aortic valve replacement New York Heart Association class I fully recovered and his postoperative echocardiogram showed normal valve function and mildly reduced EF we will recheck this year.  If EF is worsening may benefit from Entresto Stable having no angina have mild nonobstructive CAD will continue aspirin and his high intensity statin Well-controlled continue his current antihypertensive metoprolol and calcium channel blocker and has upcoming labs with his PCP Continue his high intensity statin upcoming labs with his PCP   Next appointment: 1 year   Medication Adjustments/Labs and Tests Ordered: Current medicines are reviewed at length with the patient today.  Concerns regarding medicines are outlined above.  No orders of the defined types were placed in this encounter.  No orders of the defined types were placed in this encounter.   Chief complaint follow-up AVR   History of Present Illness:    Robert Li is a 66 y.o. male with a hx of  severe symptomatic aortic stenosis with peak and mean gradients of 126/87 mmHg nonobstructive CAD hypertension with severe LVH and hyperlipidemia and subsequent surgical AVR 10/10/2020 last seen 02/02/2021.  Compliance with diet, lifestyle and medications: Yes  He thinks he is fully recovered from his surgery Unfortunately not getting activity on a regular basis and I challenged him to do regular walking 30  minutes a day He has never returned to cigarette smoking He tolerates his statin without muscle pain or weakness He has no exercise intolerance edema shortness of breath chest pain palpitations syncope Has had no unexplained fever or chill  His postoperative surgical AVR echo showed normal valve function mildly reduced EF 45 to 50%. Past Medical History:  Diagnosis Date   Benign essential hypertension    Diabetes mellitus with renal complications (HCC)    History of alcohol abuse    Hypercholesterolemia    Onychomycosis    Severe aortic stenosis    Tobacco abuse     Past Surgical History:  Procedure Laterality Date   AORTIC VALVE REPLACEMENT N/A 10/10/2020   Procedure: AORTIC VALVE REPLACEMENT (AVR) WITH INSPIRIS AORTIC VALVE;  Surgeon: [name], MD;  Location: [facility] OR;  Service: Open Heart Surgery;  Laterality: N/A;   NO PAST SURGERIES     RIGHT HEART CATH AND CORONARY/GRAFT ANGIOGRAPHY N/A 09/01/2020   Procedure: RIGHT HEART CATH AND CORONARY/GRAFT ANGIOGRAPHY;  Surgeon: Kathleene Hazel, MD;  Location: [facility] INVASIVE CV LAB;  Service: Cardiovascular;  Laterality: N/A;   TEE WITHOUT CARDIOVERSION N/A 10/10/2020   Procedure: TRANSESOPHAGEAL ECHOCARDIOGRAM (TEE);  Surgeon: [name], MD;  Location: [facility] OR;  Service: Open Heart Surgery;  Laterality: N/A;    Current Medications: Current Meds  Medication Sig   amLODipine (NORVASC) 5 MG tablet Take 5 mg by mouth daily.   amoxicillin (AMOXIL) 500 MG capsule Take 2000 mg 45 minutes prior to dental procedures.   aspirin EC  81 MG tablet Take 81 mg by mouth daily. Swallow whole.   CINNAMON PO Take 1,500 mg by mouth in the morning. Currently taking 1200   JARDIANCE 10 MG TABS tablet Take 10 mg by mouth in the morning.   Melatonin 10 MG TABS Take 10 mg by mouth at bedtime.   metFORMIN (GLUCOPHAGE) 850 MG tablet Take 850 mg by mouth 2 (two) times daily.   metoprolol tartrate (LOPRESSOR) 25 MG tablet TAKE 1 TABLET BY MOUTH  TWICE A DAY   Multiple Vitamin (MULTIVITAMIN WITH MINERALS) TABS tablet Take 1 tablet by mouth in the morning.   rosuvastatin (CRESTOR) 5 MG tablet Take 5 mg by mouth at bedtime.   tamsulosin (FLOMAX) 0.4 MG CAPS capsule Take 0.4 mg by mouth at bedtime.   triamcinolone cream (KENALOG) 0.1 % Apply 1 application topically 2 (two) times daily as needed (red skin/irritation).     Allergies:   Altace [ramipril], Hctz [hydrochlorothiazide], and Wellbutrin [bupropion]   Social History   Socioeconomic History   Marital status: Unknown    Spouse name: Not on file   Number of children: Not on file   Years of education: Not on file   Highest education level: Not on file  Occupational History   Not on file  Tobacco Use   Smoking status: Former    Packs/day: 1.00    Types: Cigarettes    Quit date: 10/10/2020    Years since quitting: 1.3   Smokeless tobacco: Never  Vaping Use   Vaping Use: Never used  Substance and Sexual Activity   Alcohol use: Yes    Alcohol/week: 35.0 standard drinks of alcohol    Types: 35 Cans of beer per week   Drug use: Yes    Types: Marijuana    Comment: every day   Sexual activity: Not on file  Other Topics Concern   Not on file  Social History Narrative   Not on file   Social Determinants of Health   Financial Resource Strain: Not on file  Food Insecurity: Not on file  Transportation Needs: Not on file  Physical Activity: Not on file  Stress: Not on file  Social Connections: Not on file     Family History: The patient's family history includes Alzheimer's disease in his mother; Colon cancer in his father; Diabetes in his father; Hypertension in his father. ROS:   Please see the history of present illness.    All other systems reviewed and are negative.  EKGs/Labs/Other Studies Reviewed:    The following studies were reviewed today:  EKG:  EKG ordered today and personally reviewed.  The ekg ordered today demonstrates sinus rhythm nonspecific  conduction delay left axis deviation repolarization changes similar to previous EKG  Recent Labs: No results found for requested labs within last 365 days.  Recent Lipid Panel    Component Value Date/Time   CHOL 133 10/30/2020 1432   TRIG 428 (H) 10/30/2020 1432   HDL 38 (L) 10/30/2020 1432   CHOLHDL 3.5 10/30/2020 1432   LDLCALC 34 10/30/2020 1432    Physical Exam:    VS:  BP 124/78 (BP Location: Right Arm, Patient Position: Sitting, Cuff Size: Normal)   Pulse 60   Ht 5\' 5"  (1.651 m)   Wt 149 lb (67.6 kg)   SpO2 98%   BMI 24.79 kg/m     Wt Readings from Last 3 Encounters:  01/28/22 149 lb (67.6 kg)  02/02/21 143 lb 12.8 oz (65.2 kg)  11/12/20 134  lb (60.8 kg)     GEN:  Well nourished, well developed in no acute distress HEENT: Normal NECK: No JVD; No carotid bruits LYMPHATICS: No lymphadenopathy CARDIAC: RRR, no murmurs, rubs, gallops RESPIRATORY:  Clear to auscultation without rales, wheezing or rhonchi  ABDOMEN: Soft, non-tender, non-distended MUSCULOSKELETAL:  No edema; No deformity  SKIN: Warm and dry NEUROLOGIC:  Alert and oriented x 3 PSYCHIATRIC:  Normal affect    Signed, Norman Herrlich, MD  01/28/2022 9:52 AM    [facility]

## 2022-01-28 NOTE — Addendum Note (Signed)
Addended by: Roxanne Mins I on: 01/28/2022 10:13 AM   Modules accepted: Orders

## 2022-02-10 ENCOUNTER — Ambulatory Visit: Payer: Medicare Other | Attending: Cardiology

## 2022-02-10 ENCOUNTER — Telehealth: Payer: Self-pay | Admitting: Cardiology

## 2022-02-10 DIAGNOSIS — I25118 Atherosclerotic heart disease of native coronary artery with other forms of angina pectoris: Secondary | ICD-10-CM | POA: Insufficient documentation

## 2022-02-10 DIAGNOSIS — I119 Hypertensive heart disease without heart failure: Secondary | ICD-10-CM | POA: Diagnosis present

## 2022-02-10 DIAGNOSIS — Z952 Presence of prosthetic heart valve: Secondary | ICD-10-CM | POA: Insufficient documentation

## 2022-02-10 DIAGNOSIS — E7800 Pure hypercholesterolemia, unspecified: Secondary | ICD-10-CM | POA: Insufficient documentation

## 2022-02-10 LAB — ECHOCARDIOGRAM COMPLETE
AR max vel: 1.05 cm2
AV Area VTI: 1.14 cm2
AV Area mean vel: 1.13 cm2
AV Mean grad: 8 mmHg
AV Peak grad: 13.8 mmHg
Ao pk vel: 1.86 m/s
Area-P 1/2: 2.99 cm2
S' Lateral: 2.8 cm

## 2022-02-10 NOTE — Telephone Encounter (Signed)
Pt is returning call. Requesting return call.  

## 2022-02-11 NOTE — Telephone Encounter (Signed)
Results reviewed with pt as per Dr. Munley's note.  Pt verbalized understanding and had no additional questions. Routed to PCP  

## 2022-03-26 ENCOUNTER — Telehealth: Payer: Self-pay | Admitting: Cardiology

## 2022-03-26 MED ORDER — METOPROLOL TARTRATE 25 MG PO TABS: 25.0000 mg | ORAL_TABLET | Freq: Two times a day (BID) | ORAL | 3 refills | Status: DC

## 2022-03-26 NOTE — Telephone Encounter (Signed)
Patient came in requesting a refill to be sent in for his Lopressor. [facility] [address] in [place]

## 2022-03-26 NOTE — Telephone Encounter (Signed)
Metoprolol tartrate 25 mg # 180 x 3 refills sent to [facility] [address], [place]

## 2022-12-17 IMAGING — CR DG CHEST 2V
2 series · 2 of 2 positions shown · non-contrast
Comparison: CT 09/08/2020

CLINICAL DATA: Preop aortic valve stenosis

EXAM:
CHEST - 2 VIEW

[w chest pa]
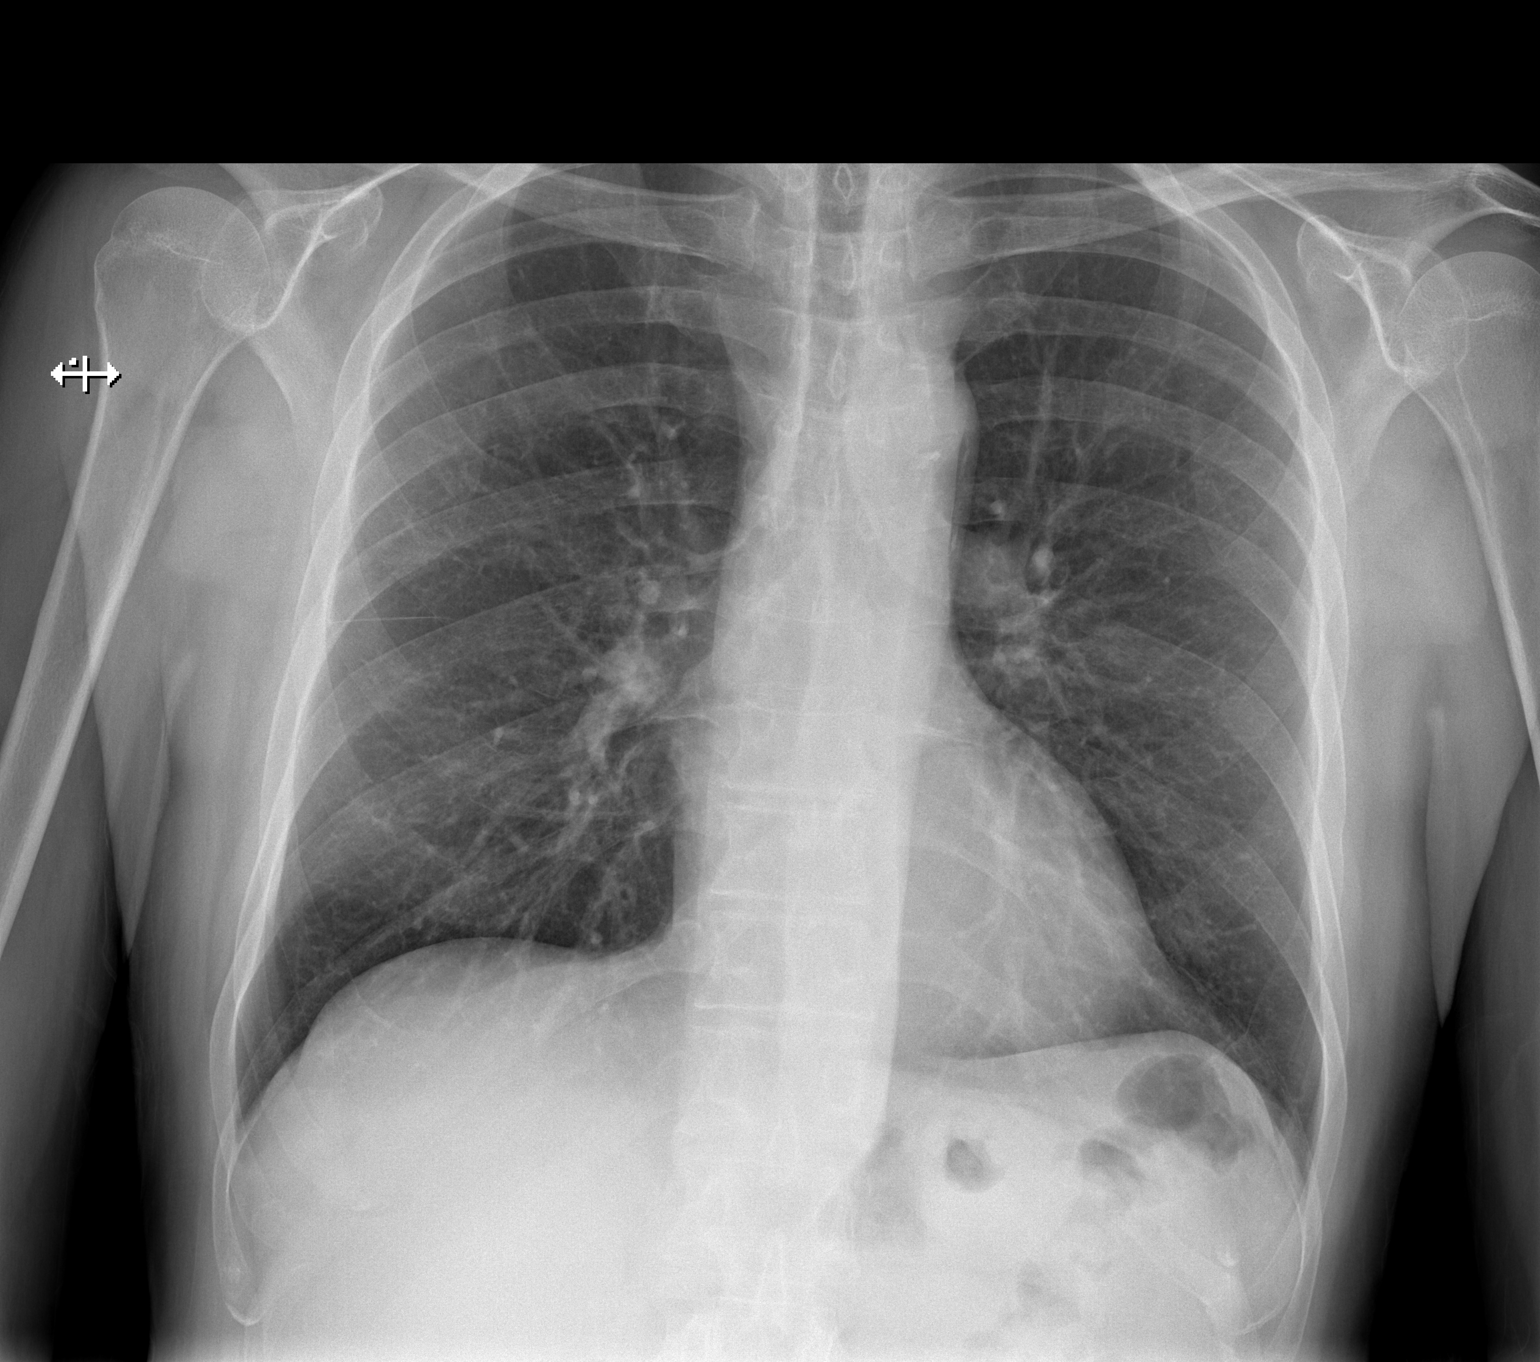

[w chest lat]
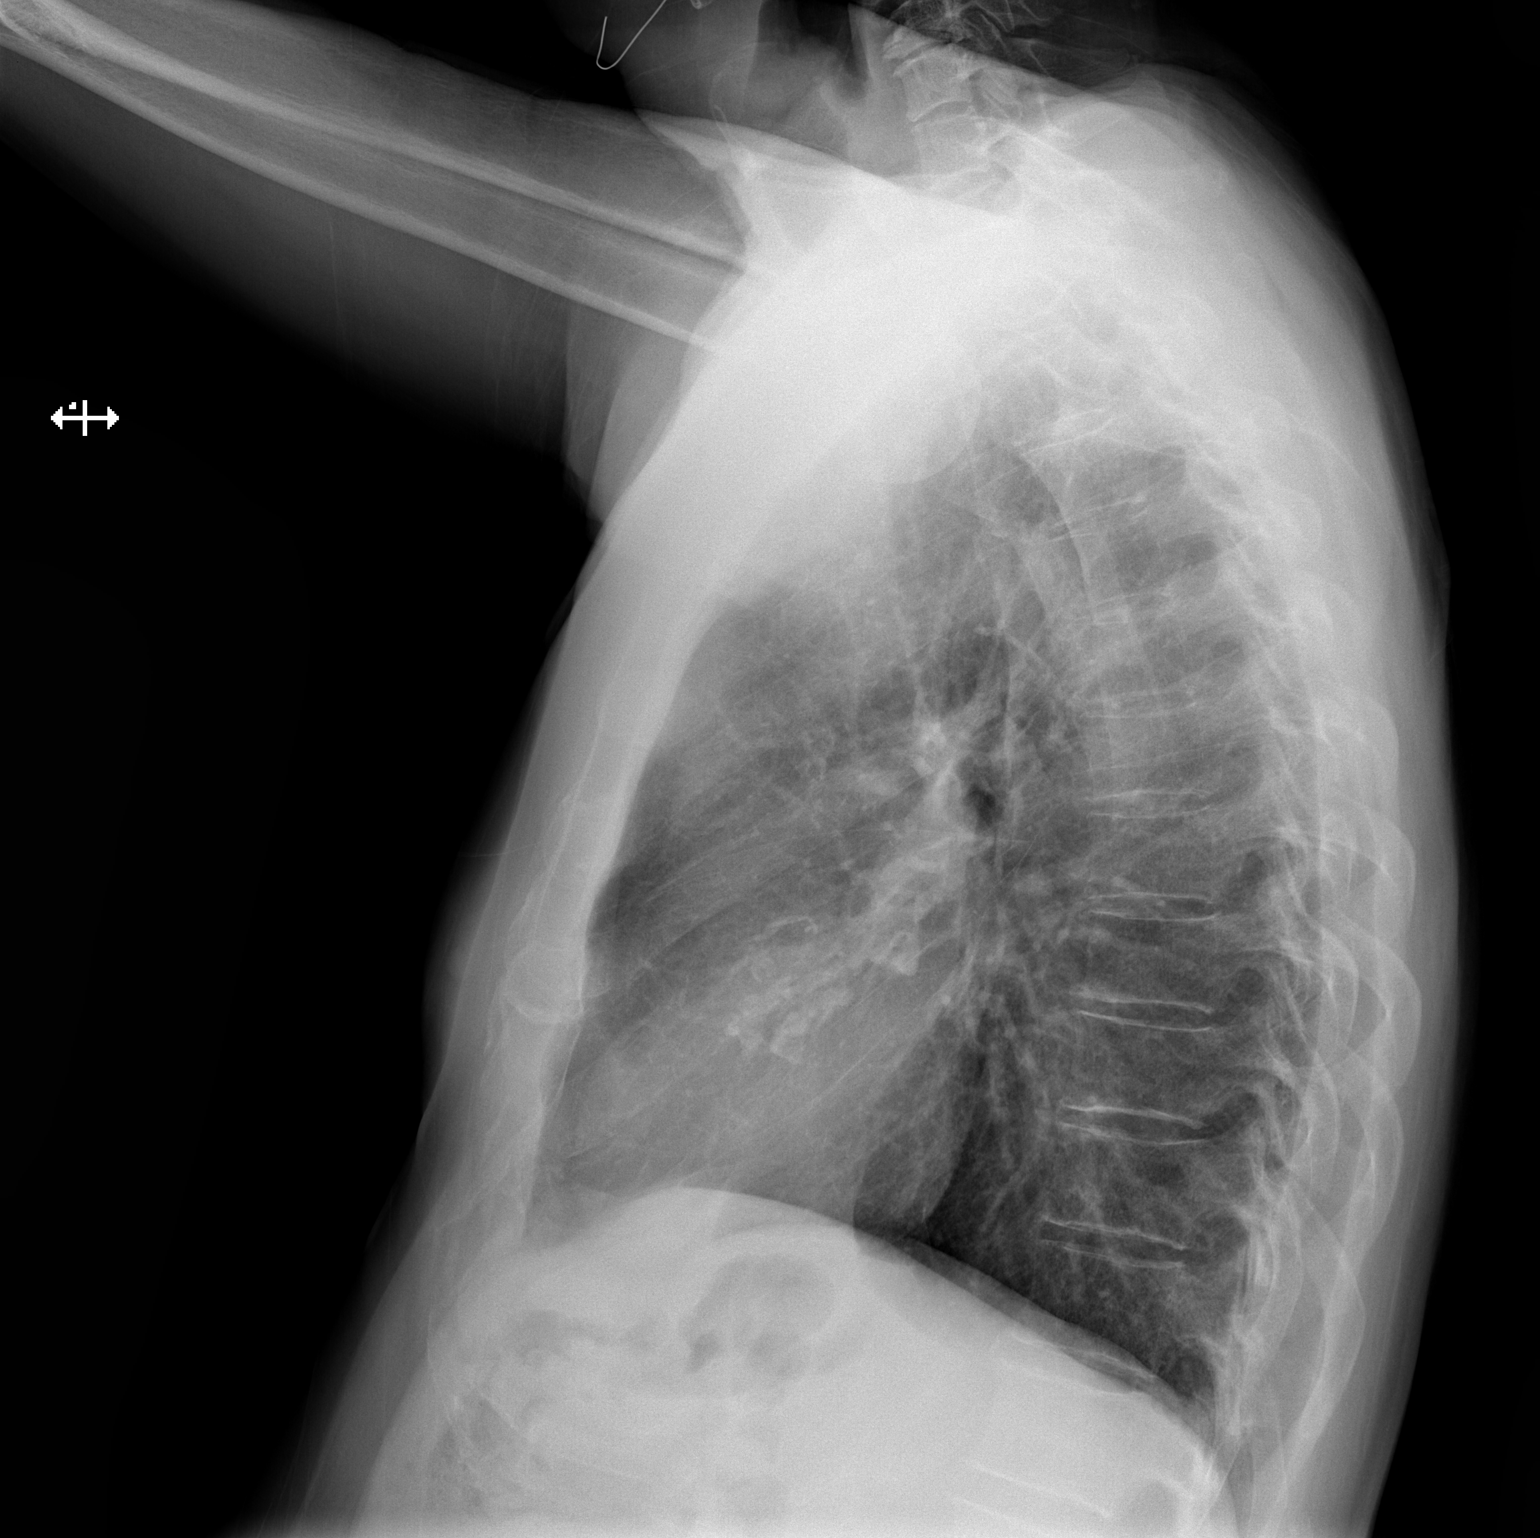

[2 of 2 positions shown; findings below may reference images not displayed]

FINDINGS: The heart size and mediastinal contours are within normal limits.
Both lungs are clear. The visualized skeletal structures are
unremarkable.
IMPRESSION: No active cardiopulmonary disease.

## 2022-12-19 IMAGING — CT CT ANGIO HEAD-NECK (W OR W/O PERF)
1 of 8 series · 14 of 47 positions shown · IV contrast (OMNI)
Comparison: None.

CLINICAL DATA: Neuro deficit, acute, stroke suspected

EXAM:
CT ANGIOGRAPHY HEAD AND NECK
TECHNIQUE: Multidetector CT imaging of the head and neck was performed using
the standard protocol during bolus administration of intravenous
contrast. Multiplanar CT image reconstructions and MIPs were
obtained to evaluate the vascular anatomy. Carotid stenosis
measurements (when applicable) are obtained utilizing NASCET
criteria, using the distal internal carotid diameter as the
denominator.

[Series 12: thin · axial · 0.49mm/px · z∈[-334,-2]mm · 14 of 765 slices shown]
[im 51/765  brain]
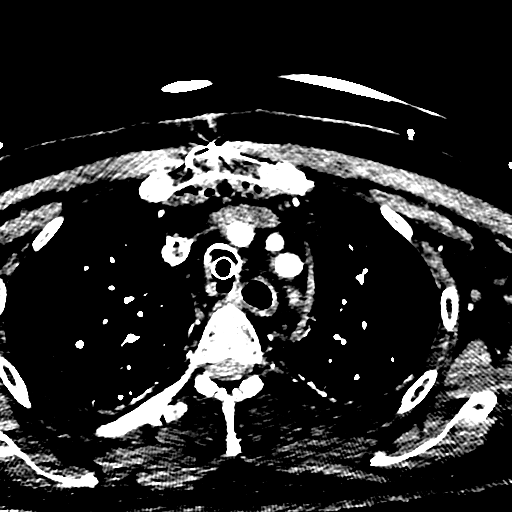
[im 102/765  bone]
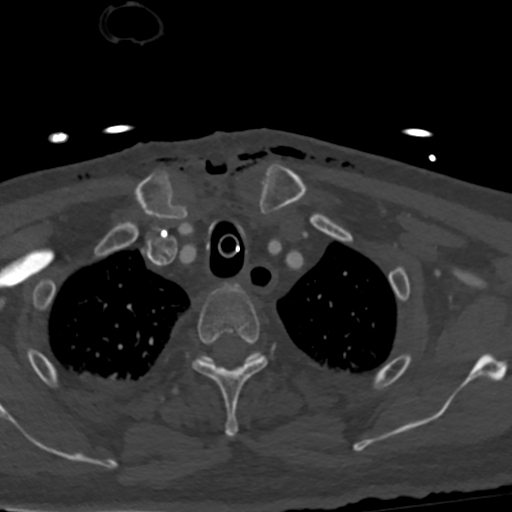
[im 153/765  brain]
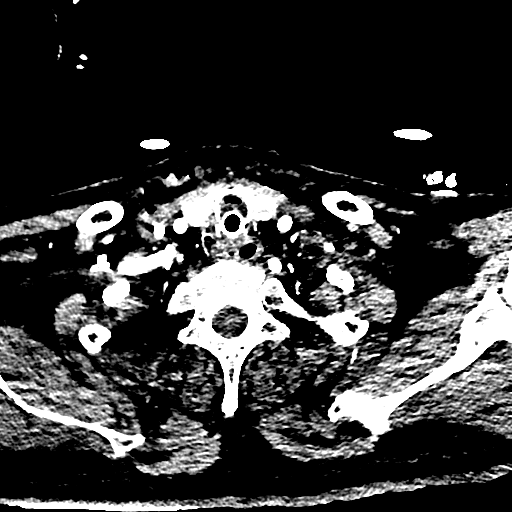
[im 204/765  bone]
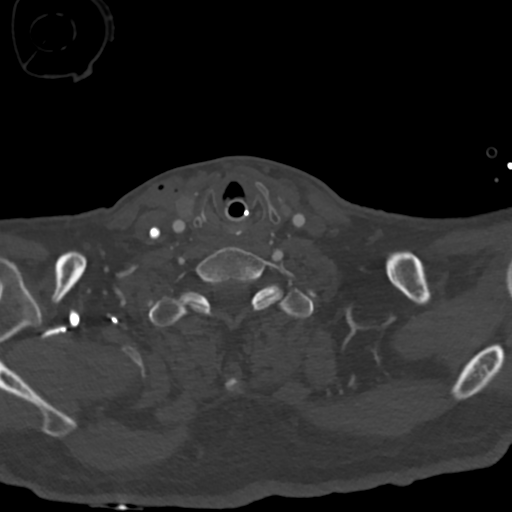
[im 255/765  brain]
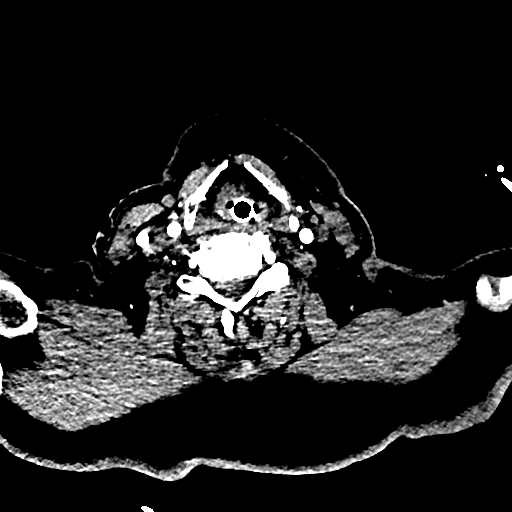
[im 306/765  bone]
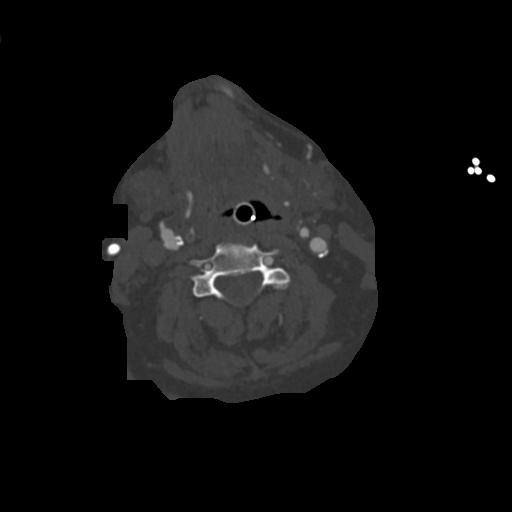
[im 357/765  brain]
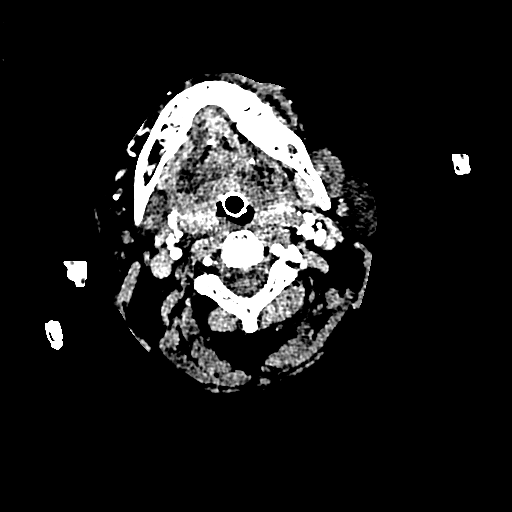
[im 408/765  bone]
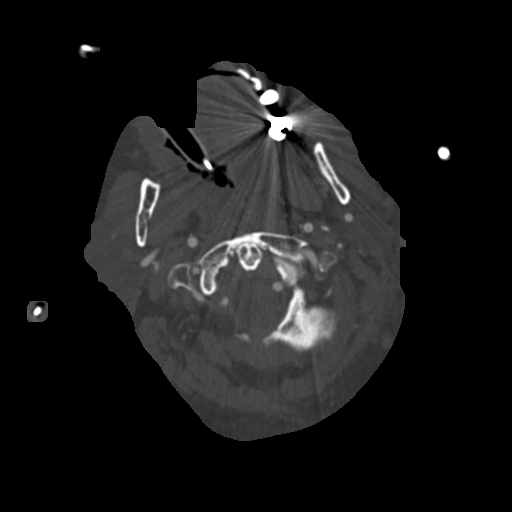
[im 459/765  brain]
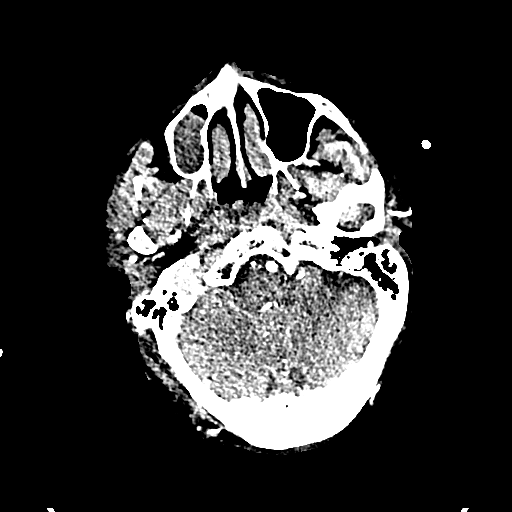
[im 510/765  bone]
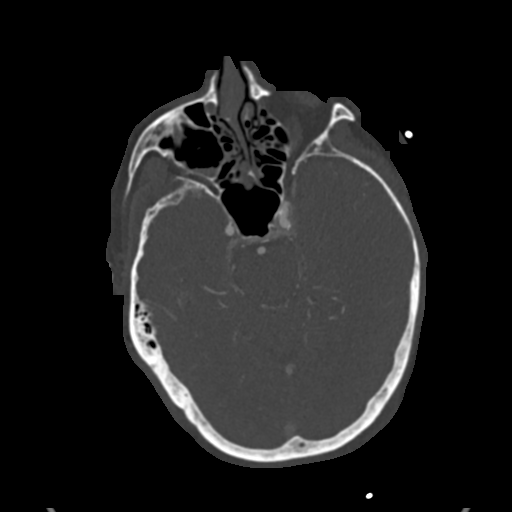
[im 561/765  brain]
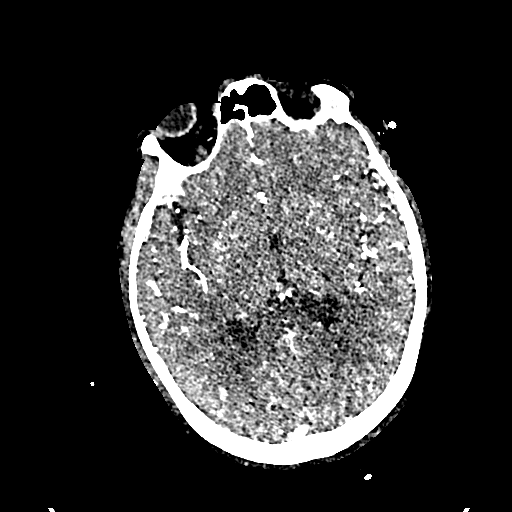
[im 612/765  bone]
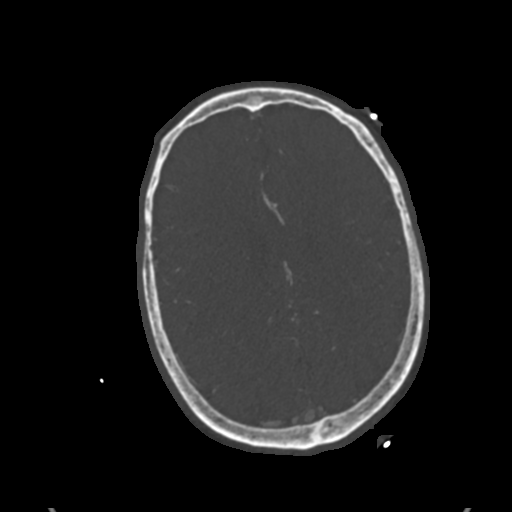
[im 663/765  brain]
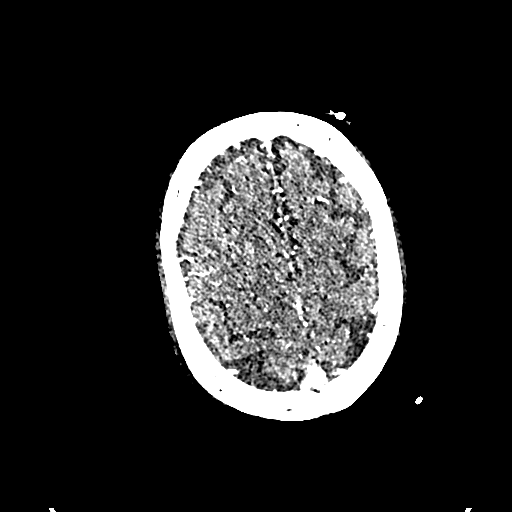
[im 714/765  bone]
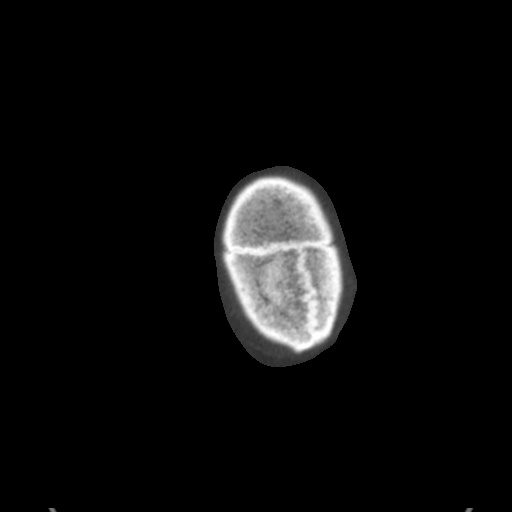

[14 of 47 positions shown; findings below may reference images not displayed]

FINDINGS: CT HEAD

Brain: There is no acute intracranial hemorrhage, mass effect, or
edema. Gray-white differentiation is preserved. There is no
extra-axial fluid collection. Patchy low-attenuation in the
supratentorial white matter is nonspecific but may reflect mild to
moderate chronic microvascular ischemic changes. There are
age-indeterminate small vessel infarcts of the central gray nuclei
bilaterally.

Vascular: No hyperdense vessel.

Skull: Calvarium is unremarkable.

Sinuses/Orbits: Paranasal sinus mucosal thickening. Right maxillary
sinus retention cyst or polyp. Orbits are unremarkable.

Other: -L2 L

ASPECTS (Alberta Stroke Program Early CT Score)

- Ganglionic level infarction (caudate, lentiform nuclei, internal
capsule, insula, M1-M3 cortex): 7

- Supraganglionic infarction (M4-M6 cortex): 3

Total score (0-10 with 10 being normal): 10

CTA NECK

Aortic arch: Mixed plaque along the included arch. Great vessel
origins are patent. No high-grade proximal subclavian artery
stenosis.

Right carotid system: Patent. Calcified plaque along the proximal
internal carotid causing minimal stenosis.

Left carotid system: Patent. Mixed plaque along the proximal
internal carotid causing less than 50% stenosis.

Vertebral arteries: Patent. Left vertebral artery is dominant. There
is multifocal extrinsic compression from osteophytes with moderate
stenosis. Plaque is present at the right vertebral origin with at
least mild stenosis.

Skeleton: Partially imaged sternotomy. Degenerative changes of the
cervical spine.

Other neck: Left lower neck subcutaneous edema. Retained secretions
in the pharynx.

Upper chest: Postoperative changes with foci of air in the chest
wall and mediastinum. Endotracheal tube is partially imaged.

Review of the MIP images confirms the above findings

CTA HEAD

Anterior circulation: Intracranial internal carotid arteries are
patent with primarily calcified plaque causing mild stenosis.
Anterior cerebral arteries are patent with anterior communicating
artery present. Middle cerebral arteries are patent.

Posterior circulation: Intracranial vertebral arteries are patent.
Mild calcified plaque is present. Basilar artery is patent. Major
cerebellar artery origins are patent. Posterior cerebral arteries
are patent.

Venous sinuses: Patent as allowed by contrast bolus timing.

Review of the MIP images confirms the above findings
IMPRESSION: No acute intracranial hemorrhage or evidence of acute infarction.
Age-indeterminate small vessel infarcts of the central gray nuclei.
Chronic microvascular ischemic changes.

No large vessel occlusion, hemodynamically significant stenosis, or
evidence of dissection.

These results were communicated to Dr. Vs at [DATE] on
10/10/2020 by text page via the AMION messaging system.

## 2022-12-20 IMAGING — DX DG CHEST 1V PORT
1 series · 1 of 1 positions shown · non-contrast
Comparison: Yesterday

CLINICAL DATA: Pneumothorax follow-up

EXAM:
PORTABLE CHEST 1 VIEW

[chest]
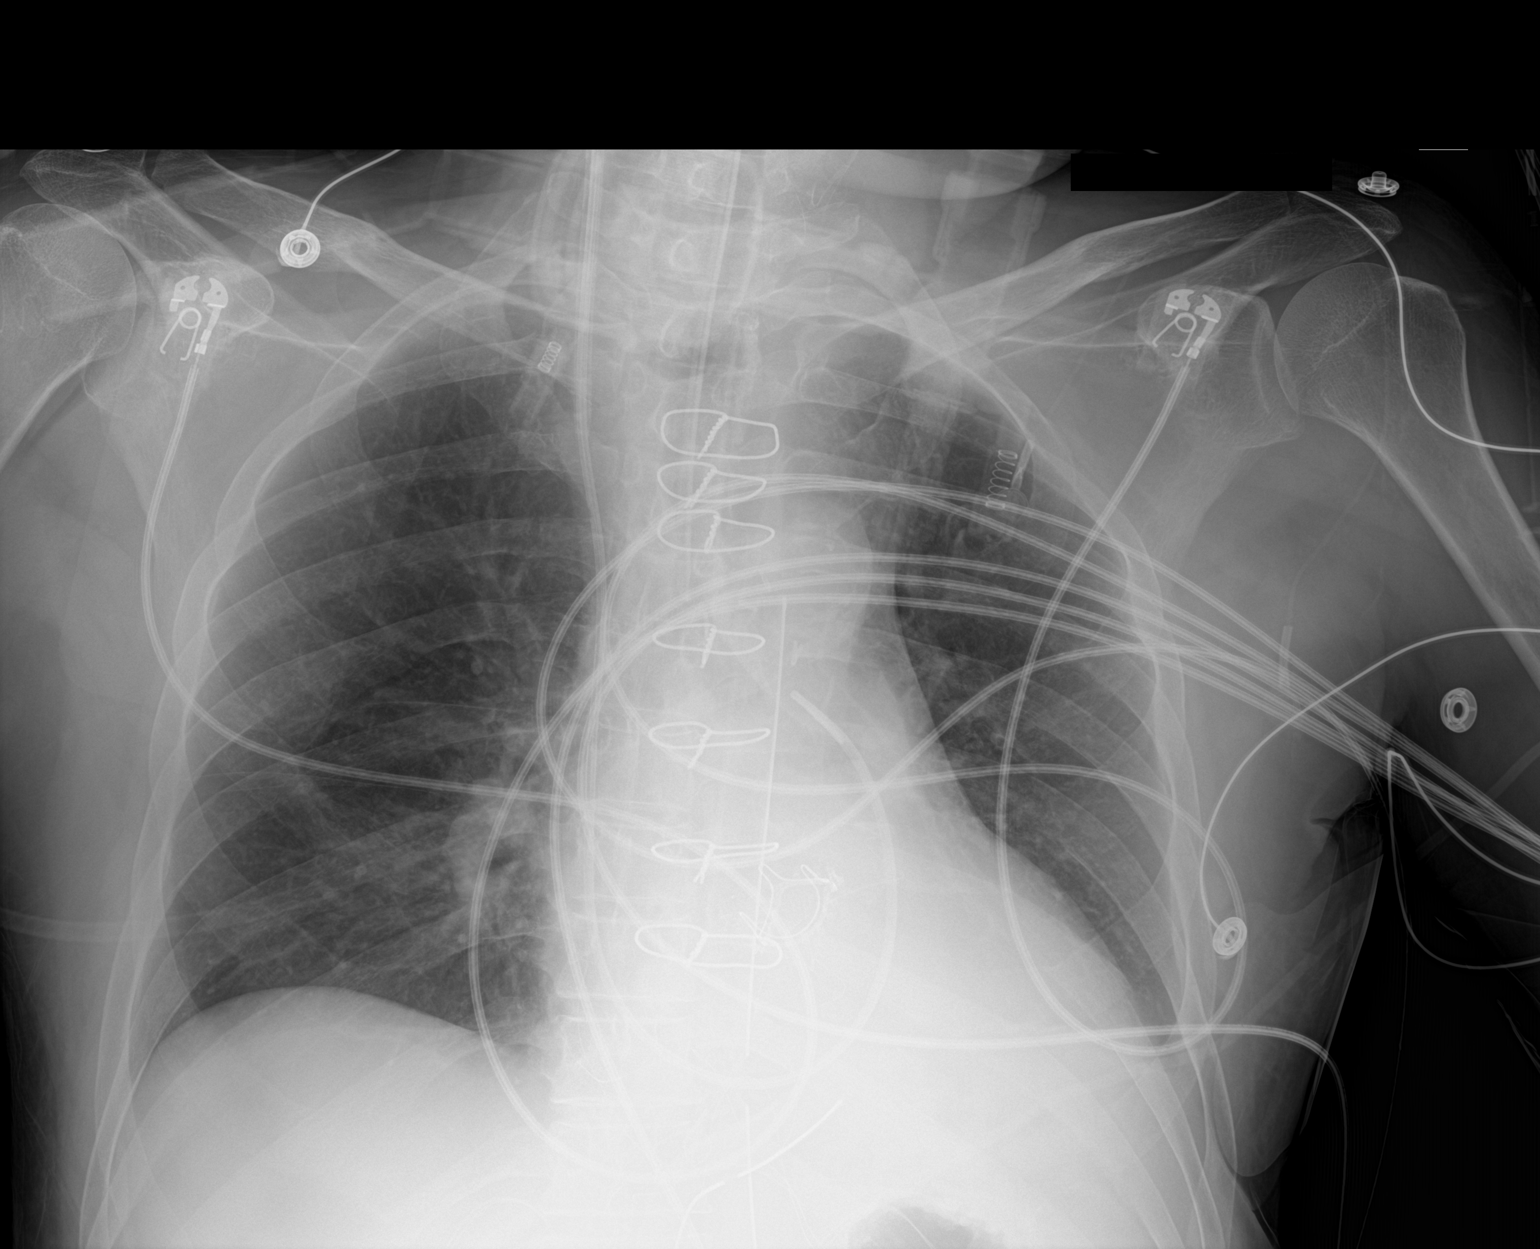

[1 of 1 positions shown; findings below may reference images not displayed]

FINDINGS: Endotracheal tube with tip 13 mm above the carina. Chest drains in
place. Swan-Ganz catheter from the right with tip at the main
pulmonary artery. Presumed atelectasis behind the heart. Stable
heart size after aortic valve replacement. No visible pneumothorax.
IMPRESSION: Stable hardware positioning and retrocardiac atelectasis.

## 2023-03-22 ENCOUNTER — Ambulatory Visit: Payer: Medicare Other | Admitting: Cardiology

## 2023-03-22 NOTE — Progress Notes (Unsigned)
Cardiology Office Note:    Date:  03/23/2023   ID:  Robert Li, Robert Li Aug 06, 1955, MRN [id number]  PCP:  Noni Saupe, MD  Cardiologist:  Norman Herrlich, MD    Referring MD: Noni Saupe, MD    ASSESSMENT:    1. S/P AVR (aortic valve replacement)   2. Coronary artery disease of native artery of native heart with stable angina pectoris (HCC)   3. Hypertensive heart disease without heart failure   4. Hypercholesterolemia    PLAN:    In order of problems listed above:  Robert Li continues to do well after surgical AVR New York Heart Association class I continue his medical therapy including aspirin calcium channel blocker for hypertension Norvasc 5 mg daily beta-blocker Lopressor 25 mg twice daily and his high intensity statin rosuvastatin 5 mg at bedtime.  He will need a follow-up echocardiogram in 5 years 2027 Stable CAD did not require revascularization is not having angina Good control of blood pressure continues amlodipine he takes an alpha-blocker for BPH and when he stands up his blood pressure in the range of 100 systolic I asked him to be careful shifting posture Continue his statin he tells me his upcoming labs in his PCP office   Next appointment: 1 year   Medication Adjustments/Labs and Tests Ordered: Current medicines are reviewed at length with the patient today.  Concerns regarding medicines are outlined above.  Orders Placed This Encounter  Procedures   EKG 12-Lead   No orders of the defined types were placed in this encounter.    History of Present Illness:    Robert Li is a 68 y.o. male with a hx of symptomatic severe aortic stenosis mild nonobstructive CAD hypertension hyperlipidemia and surgical AVR 10/10/2020 last seen 01/28/2022.  An echocardiogram performed 02/10/2022 the surgical AVR function was normal.  Left ventricle is normal in size EF 60 to 65% normal right ventricular size function.  Compliance with diet, lifestyle and  medications: Yes  Overall is doing well but his wife says he has become sedentary and I challenged him 250 minutes of activity walking week or 6500 steps daily she will encourage him At times he is lightheaded especially if he has prolonged immobilization not severe and has not had syncope he takes an alpha-blocker He has had no edema orthopnea chest pain palpitation or syncope EKG is stable Past Medical History:  Diagnosis Date   Aortic stenosis, severe 10/10/2020   Benign essential hypertension    Coronary artery disease involving native coronary artery of native heart without angina pectoris    Diabetes mellitus with renal complications (HCC)    History of alcohol abuse    Hypercholesterolemia    Hypertensive heart disease 08/18/2020   Onychomycosis    S/P aortic valve replacement with bioprosthetic valve 10/10/2020   Size 23 mm     Severe aortic stenosis    Tobacco abuse     Current Medications: Current Meds  Medication Sig   amLODipine (NORVASC) 5 MG tablet Take 5 mg by mouth daily.   amoxicillin (AMOXIL) 500 MG capsule Take 2000 mg 45 minutes prior to dental procedures.   aspirin EC 81 MG tablet Take 81 mg by mouth daily. Swallow whole.   CINNAMON PO Take 1,500 mg by mouth in the morning. Currently taking 1200   JARDIANCE 25 MG TABS tablet Take 25 mg by mouth daily.   metoprolol tartrate (LOPRESSOR) 25 MG tablet Take 1 tablet (25 mg total) by mouth  2 (two) times daily.   Multiple Vitamin (MULTIVITAMIN WITH MINERALS) TABS tablet Take 1 tablet by mouth in the morning.   OZEMPIC, 1 MG/DOSE, 4 MG/3ML SOPN Inject 1 mg into the skin once a week.   rosuvastatin (CRESTOR) 5 MG tablet Take 5 mg by mouth at bedtime.   tamsulosin (FLOMAX) 0.4 MG CAPS capsule Take 0.4 mg by mouth at bedtime.   triamcinolone cream (KENALOG) 0.1 % Apply 1 application topically 2 (two) times daily as needed (red skin/irritation).      EKGs/Labs/Other Studies Reviewed:    The following studies were  reviewed today:     EKG Interpretation Date/Time:  Wednesday March 23 2023 08:18:42 EST Ventricular Rate:  65 PR Interval:  170 QRS Duration:  110 QT Interval:  422 QTC Calculation: 438 R Axis:   -66  Text Interpretation: Normal sinus rhythm Left axis deviation Incomplete left bundle branch block Nonspecific ST and T wave abnormality Abnormal ECG When compared with ECG of 11-Oct-2020 07:17, Sinus rhythm has replaced Junctional rhythm and improved T inversion Confirmed by Norman Herrlich ([id number]) on 03/23/2023 8:25:41 AM  EKG Interpretation Date/Time:  Wednesday March 23 2023 08:18:42 EST Ventricular Rate:  65 PR Interval:  170 QRS Duration:  110 QT Interval:  422 QTC Calculation: 438 R Axis:   -66  Text Interpretation: Normal sinus rhythm Left axis deviation Incomplete left bundle branch block Nonspecific ST and T wave abnormality Abnormal ECG When compared with ECG of 11-Oct-2020 07:17, Sinus rhythm has replaced Junctional rhythm and improved T inversion Confirmed by Norman Herrlich ([id number]) on 03/23/2023 8:25:41 AM   Recent Labs: No results found for requested labs within last 365 days.  Recent Lipid Panel    Component Value Date/Time   CHOL 133 10/30/2020 1432   TRIG 428 (H) 10/30/2020 1432   HDL 38 (L) 10/30/2020 1432   CHOLHDL 3.5 10/30/2020 1432   LDLCALC 34 10/30/2020 1432    Physical Exam:    VS:  BP 130/74   Pulse 65   Ht 5\' 7"  (1.702 m)   Wt 143 lb 9.6 oz (65.1 kg)   SpO2 98%   BMI 22.49 kg/m     Wt Readings from Last 3 Encounters:  03/23/23 143 lb 9.6 oz (65.1 kg)  01/28/22 149 lb (67.6 kg)  02/02/21 143 lb 12.8 oz (65.2 kg)     GEN:  Well nourished, well developed in no acute distress HEENT: Normal NECK: No JVD; No carotid bruits LYMPHATICS: No lymphadenopathy CARDIAC: RRR, no murmurs, rubs, gallops RESPIRATORY:  Clear to auscultation without rales, wheezing or rhonchi  ABDOMEN: Soft, non-tender, non-distended MUSCULOSKELETAL:  No edema; No  deformity  SKIN: Warm and dry NEUROLOGIC:  Alert and oriented x 3 PSYCHIATRIC:  Normal affect    Signed, Norman Herrlich, MD  03/23/2023 9:18 AM    [facility]

## 2023-03-23 ENCOUNTER — Encounter: Payer: Self-pay | Admitting: Cardiology

## 2023-03-23 ENCOUNTER — Ambulatory Visit: Payer: Medicare Other | Attending: Cardiology | Admitting: Cardiology

## 2023-03-23 VITALS — BP 130/74 | HR 65 | Ht 67.0 in | Wt 143.6 lb

## 2023-03-23 DIAGNOSIS — Z952 Presence of prosthetic heart valve: Secondary | ICD-10-CM | POA: Diagnosis present

## 2023-03-23 DIAGNOSIS — E7800 Pure hypercholesterolemia, unspecified: Secondary | ICD-10-CM

## 2023-03-23 DIAGNOSIS — I119 Hypertensive heart disease without heart failure: Secondary | ICD-10-CM

## 2023-03-23 DIAGNOSIS — I25118 Atherosclerotic heart disease of native coronary artery with other forms of angina pectoris: Secondary | ICD-10-CM | POA: Diagnosis not present

## 2023-03-23 NOTE — Patient Instructions (Signed)
Medication Instructions:  Your physician recommends that you continue on your current medications as directed. Please refer to the Current Medication list given to you today.  *If you need a refill on your cardiac medications before your next appointment, please call your pharmacy*   Lab Work: None If you have labs (blood work) drawn today and your tests are completely normal, you will receive your results only by: MyChart Message (if you have MyChart) OR A paper copy in the mail If you have any lab test that is abnormal or we need to change your treatment, we will call you to review the results.   Testing/Procedures: None   Follow-Up: At [facility], you and your health needs are our priority.  As part of our continuing mission to provide you with exceptional heart care, we have created designated Provider Care Teams.  These Care Teams include your primary Cardiologist (physician) and Advanced Practice Providers (APPs -  Physician Assistants and Nurse Practitioners) who all work together to provide you with the care you need, when you need it.  We recommend signing up for the patient portal called "MyChart".  Sign up information is provided on this After Visit Summary.  MyChart is used to connect with patients for Virtual Visits (Telemedicine).  Patients are able to view lab/test results, encounter notes, upcoming appointments, etc.  Non-urgent messages can be sent to your provider as well.   To learn more about what you can do with MyChart, go to [email or web address].    Your next appointment:   1 year(s)  Provider:   Norman Herrlich, MD    Other Instructions 20 - 30 minutes of walking per day

## 2023-03-25 ENCOUNTER — Other Ambulatory Visit: Payer: Self-pay | Admitting: Cardiology

## 2023-09-23 ENCOUNTER — Other Ambulatory Visit: Payer: Self-pay | Admitting: Urology

## 2023-09-23 DIAGNOSIS — R972 Elevated prostate specific antigen [PSA]: Secondary | ICD-10-CM

## 2023-11-04 ENCOUNTER — Ambulatory Visit
Admission: RE | Admit: 2023-11-04 | Discharge: 2023-11-04 | Disposition: A | Discharge status: Home / Self Care | Source: Ambulatory Visit | Attending: Urology | Admitting: Urology

## 2023-11-04 DIAGNOSIS — R972 Elevated prostate specific antigen [PSA]: Secondary | ICD-10-CM

## 2023-11-04 MED ORDER — GADOPICLENOL 0.5 MMOL/ML IV SOLN
6.0000 mL | Freq: Once | INTRAVENOUS | Status: AC | PRN
  Administered 2023-11-04: 6 mL via INTRAVENOUS

## 2023-11-17 ENCOUNTER — Telehealth: Payer: Self-pay

## 2023-11-17 NOTE — Telephone Encounter (Signed)
   Pre-operative Risk Assessment    Patient Name: Robert Li  DOB: November 21, 1955 MRN: [id number]   Date of last office visit: 03/23/23 Robert LEITER, MD Date of next office visit: NONE   Request for Surgical Clearance    Procedure:  PROSTATE ULTRASOUND BIOPSY  Date of Surgery:  Clearance 12/13/23                                Surgeon:  DR NORLEEN [facility] Surgeon's Group or Practice Name:  [facility] Phone number:  [phone] Fax number:  [phone]   Type of Clearance Requested:   - Medical  - Pharmacy:  Hold Aspirin  5 DAYS PRIOR   Type of Anesthesia:  Not Indicated   Additional requests/questions:    SignedLucie DELENA Ku   11/17/2023, 8:48 AM

## 2023-11-17 NOTE — Telephone Encounter (Signed)
 1st attempt: Called patient, left message with wife to give him the message to contact our office to schedule telehealth visit.

## 2023-11-17 NOTE — Telephone Encounter (Signed)
   Name: Robert Li  DOB: 1955-10-30  MRN: [id number]  Primary Cardiologist: Redell Leiter, MD   Preoperative team, please contact this patient and set up a phone call appointment for further preoperative risk assessment. Please obtain consent and complete medication review. Thank you for your help.  I confirm that guidance regarding antiplatelet and oral anticoagulation therapy has been completed and, if necessary, noted below.  Regarding ASA therapy, we recommend continuation of ASA throughout the perioperative period. However, if the surgeon feels that cessation of ASA is required in the perioperative period, it may be stopped 5-7 days prior to surgery with a plan to resume it as soon as felt to be feasible from a surgical standpoint in the post-operative period.    I also confirmed the patient resides in the state of [place] . As per [facility] Medical Board telemedicine laws, the patient must reside in the state in which the provider is licensed.   Josefa CHRISTELLA Beauvais, NP 11/17/2023, 10:03 AM [facility]

## 2023-11-17 NOTE — Telephone Encounter (Signed)
  Patient Consent for Virtual Visit         Robert Li has provided verbal consent on 11/17/2023 for a virtual visit (video or telephone). Appointment scheduled for 12/05/2023. Med req and consent. Call patient at [phone].    CONSENT FOR VIRTUAL VISIT FOR:  Robert Li  By participating in this virtual visit I agree to the following:  I hereby voluntarily request, consent and authorize [facility] and its employed or contracted physicians, physician assistants, nurse practitioners or other licensed health care professionals (the Practitioner), to provide me with telemedicine health care services (the "Services) as deemed necessary by the treating Practitioner. I acknowledge and consent to receive the Services by the Practitioner via telemedicine. I understand that the telemedicine visit will involve communicating with the Practitioner through live audiovisual communication technology and the disclosure of certain medical information by electronic transmission. I acknowledge that I have been given the opportunity to request an in-person assessment or other available alternative prior to the telemedicine visit and am voluntarily participating in the telemedicine visit.  I understand that I have the right to withhold or withdraw my consent to the use of telemedicine in the course of my care at any time, without affecting my right to future care or treatment, and that the Practitioner or I may terminate the telemedicine visit at any time. I understand that I have the right to inspect all information obtained and/or recorded in the course of the telemedicine visit and may receive copies of available information for a reasonable fee.  I understand that some of the potential risks of receiving the Services via telemedicine include:  Delay or interruption in medical evaluation due to technological equipment failure or disruption; Information transmitted may not be sufficient  (e.g. poor resolution of images) to allow for appropriate medical decision making by the Practitioner; and/or  In rare instances, security protocols could fail, causing a breach of personal health information.  Furthermore, I acknowledge that it is my responsibility to provide information about my medical history, conditions and care that is complete and accurate to the best of my ability. I acknowledge that Practitioner's advice, recommendations, and/or decision may be based on factors not within their control, such as incomplete or inaccurate data provided by me or distortions of diagnostic images or specimens that may result from electronic transmissions. I understand that the practice of medicine is not an exact science and that Practitioner makes no warranties or guarantees regarding treatment outcomes. I acknowledge that a copy of this consent can be made available to me via my patient portal [facility] MyChart), or I can request a printed copy by calling the office of [facility].    I understand that my insurance will be billed for this visit.   I have read or had this consent read to me. I understand the contents of this consent, which adequately explains the benefits and risks of the Services being provided via telemedicine.  I have been provided ample opportunity to ask questions regarding this consent and the Services and have had my questions answered to my satisfaction. I give my informed consent for the services to be provided through the use of telemedicine in my medical care

## 2023-11-17 NOTE — Telephone Encounter (Signed)
 Patient called back, Appointment scheduled for 12/05/2023. Med req and consent. Call patient at [phone].

## 2023-12-02 ENCOUNTER — Telehealth: Payer: Self-pay | Admitting: Cardiology

## 2023-12-02 ENCOUNTER — Other Ambulatory Visit: Payer: Self-pay

## 2023-12-02 DIAGNOSIS — Z952 Presence of prosthetic heart valve: Secondary | ICD-10-CM

## 2023-12-02 NOTE — Telephone Encounter (Signed)
 Wife says every year patient has a CT prior to following up with Dr. Monetta. He would like to go ahead and have this ordered if possible.

## 2023-12-02 NOTE — Telephone Encounter (Signed)
 Called the patient's wife and informed her of Dr. Leandrew recommendation below:  He had TAVR lets plan on doing his echocardiogram in November  Patient's wife verbalized understanding and had no further questions at this time. An order for an echo was placed via Epic.

## 2023-12-05 ENCOUNTER — Ambulatory Visit: Attending: Cardiology

## 2023-12-05 DIAGNOSIS — Z01810 Encounter for preprocedural cardiovascular examination: Secondary | ICD-10-CM

## 2023-12-05 NOTE — Progress Notes (Signed)
 Virtual Visit via Telephone Note   Because of Robert Li co-morbid illnesses, he is at least at moderate risk for complications without adequate follow up.  This format is felt to be most appropriate for this patient at this time.  Due to technical limitations with video connection (technology), today's appointment will be conducted as an audio only telehealth visit, and Robert Li verbally agreed to proceed in this manner.   All issues noted in this document were discussed and addressed.  No physical exam could be performed with this format.  Evaluation Performed:  Preoperative cardiovascular risk assessment _____________   Date:  12/05/2023   Patient ID:  Robert Li, DOB January 14, 1956, MRN [id number] Patient Location:  Home Provider location:   Office  Primary Care Provider:  Dottie Norleen PHEBE PONCE, MD Primary Cardiologist:  Robert Leiter, MD  Chief Complaint / Patient Profile   68 y.o. y/o male with a h/o status post AVR, coronary artery disease, hypertensive heart disease without heart failure, and hypercholesterolemia who is pending prostate ultrasound biopsy on 12/13/2023 and presents today for telephonic preoperative cardiovascular risk assessment.  History of Present Illness    Robert Li is a 67 y.o. male who presents via audio/video conferencing for a telehealth visit today.  Pt was last seen in cardiology clinic on 03/23/2023 by Dr. Leiter.  At that time Robert Li was doing well after surgical AVR with [place]  heart association class I symptoms continued on medical therapy.  The patient is now pending procedure as outlined above. Since his last visit, he tells me that his heart is fine without any issues lately.  He has had 2 hypoglycemic episodes in the past and is worried about fasting prior to his surgery.  This was due to mainly eating not enough food and being on Ozempic.  His main pastime is going to flea markets and collecting old tools as  well as toy cars to refurbish for young children.  We discussed holding aspirin .  He does meet minimum METS for clearance.  Regarding ASA therapy, we recommend continuation of ASA throughout the perioperative period. However, if the surgeon feels that cessation of ASA is required in the perioperative period, it may be stopped 5-7 days prior to surgery with a plan to resume it as soon as felt to be feasible from a surgical standpoint in the post-operative period.   Robert Li (his wife) he would prefer written instructions for surgery and she has asked me to include this in my note today.  Past Medical History    Past Medical History:  Diagnosis Date   Aortic stenosis, severe 10/10/2020   Benign essential hypertension    Coronary artery disease involving native coronary artery of native heart without angina pectoris    Diabetes mellitus with renal complications (HCC)    History of alcohol abuse    Hypercholesterolemia    Hypertensive heart disease 08/18/2020   Onychomycosis    S/P aortic valve replacement with bioprosthetic valve 10/10/2020   Size 23 mm     Severe aortic stenosis    Tobacco abuse    Past Surgical History:  Procedure Laterality Date   AORTIC VALVE REPLACEMENT N/A 10/10/2020   Procedure: AORTIC VALVE REPLACEMENT (AVR) WITH INSPIRIS AORTIC VALVE;  Surgeon: Robert Dorise POUR, MD;  Location: [facility] OR;  Service: Open Heart Surgery;  Laterality: N/A;   NO PAST SURGERIES     RIGHT HEART CATH AND CORONARY/GRAFT ANGIOGRAPHY N/A 09/01/2020   Procedure:  RIGHT HEART CATH AND CORONARY/GRAFT ANGIOGRAPHY;  Surgeon: Robert Lonni BIRCH, MD;  Location: [facility] INVASIVE CV LAB;  Service: Cardiovascular;  Laterality: N/A;   TEE WITHOUT CARDIOVERSION N/A 10/10/2020   Procedure: TRANSESOPHAGEAL ECHOCARDIOGRAM (TEE);  Surgeon: Robert Dorise POUR, MD;  Location: [facility] OR;  Service: Open Heart Surgery;  Laterality: N/A;    Allergies  Allergies  Allergen Reactions   Altace [Ramipril] Cough   Hctz  [Hydrochlorothiazide]     Unknown reaction   Wellbutrin [Bupropion] Rash    Home Medications    Prior to Admission medications   Medication Sig Start Date End Date Taking? Authorizing Provider  amLODipine (NORVASC) 5 MG tablet Take 5 mg by mouth daily. 12/22/21   [provider]  amoxicillin  (AMOXIL ) 500 MG capsule Take 2000 mg 45 minutes prior to dental procedures. 02/02/21   Robert Robert PARAS, MD  aspirin  EC 81 MG tablet Take 81 mg by mouth daily. Swallow whole.    [provider]  CINNAMON PO Take 1,500 mg by mouth in the morning. Currently taking 1200    [provider]  JARDIANCE  25 MG TABS tablet Take 25 mg by mouth daily. 03/09/23   [provider]  metoprolol  tartrate (LOPRESSOR ) 25 MG tablet Take 1 tablet (25 mg total) by mouth 2 (two) times daily. 03/25/23   Robert Robert PARAS, MD  Multiple Vitamin (MULTIVITAMIN WITH MINERALS) TABS tablet Take 1 tablet by mouth in the morning.    [provider]  OZEMPIC, 1 MG/DOSE, 4 MG/3ML SOPN Inject 1 mg into the skin once a week. 03/08/23   [provider]  rosuvastatin  (CRESTOR ) 5 MG tablet Take 5 mg by mouth at bedtime. 06/22/20   [provider]  tamsulosin (FLOMAX) 0.4 MG CAPS capsule Take 0.4 mg by mouth at bedtime. 12/14/21   [provider]  triamcinolone cream (KENALOG) 0.1 % Apply 1 application topically 2 (two) times daily as needed (red skin/irritation).    [provider]    Physical Exam    Vital Signs:  Robert Li does not have vital signs available for review today.  Given telephonic nature of communication, physical exam is limited. AAOx3. NAD. Normal affect.  Speech and respirations are unlabored.  Accessory Clinical Findings    None  Assessment & Plan    1.  Preoperative Cardiovascular Risk Assessment:  Robert Li perioperative risk of a major cardiac event is 0.4% according to the Revised Cardiac Risk Index (RCRI).  Therefore, he is  at low risk for perioperative complications.   His functional capacity is good at 5.07 METs according to the [place] Activity Status Index (DASI). Recommendations: According to ACC/AHA guidelines, no further cardiovascular testing needed.  The patient may proceed to surgery at acceptable risk.   Antiplatelet and/or Anticoagulation Recommendations: Aspirin  can be held for 5-7 days prior to his surgery.  Please resume Aspirin  post operatively when it is felt to be safe from a bleeding standpoint.   The patient was advised that if he develops new symptoms prior to surgery to contact our office to arrange for a follow-up visit, and he verbalized understanding.   A copy of this note will be routed to requesting surgeon.  Time:   Today, I have spent 20 minutes with the patient with telehealth technology discussing medical history, symptoms, and management plan.     Robert LOISE Fabry, PA-C  12/05/2023, 8:48 AM

## 2023-12-06 NOTE — Telephone Encounter (Signed)
Calling for update. Please advise  

## 2023-12-06 NOTE — Telephone Encounter (Signed)
 Clearance has been re faxed to requesting office again

## 2023-12-28 ENCOUNTER — Encounter: Payer: Self-pay | Admitting: Urology

## 2023-12-29 ENCOUNTER — Ambulatory Visit

## 2024-01-09 ENCOUNTER — Ambulatory Visit
Admission: RE | Admit: 2024-01-09 | Discharge: 2024-01-09 | Disposition: A | Source: Ambulatory Visit | Attending: Radiation Oncology | Admitting: Radiation Oncology

## 2024-01-09 ENCOUNTER — Encounter: Payer: Self-pay | Admitting: Radiation Oncology

## 2024-01-09 VITALS — BP 139/93 | HR 58 | Temp 97.5°F | Resp 18 | Ht 66.54 in | Wt 139.2 lb

## 2024-01-09 DIAGNOSIS — Z87891 Personal history of nicotine dependence: Secondary | ICD-10-CM | POA: Diagnosis not present

## 2024-01-09 DIAGNOSIS — Z7982 Long term (current) use of aspirin: Secondary | ICD-10-CM | POA: Insufficient documentation

## 2024-01-09 DIAGNOSIS — Z7985 Long-term (current) use of injectable non-insulin antidiabetic drugs: Secondary | ICD-10-CM | POA: Insufficient documentation

## 2024-01-09 DIAGNOSIS — I2510 Atherosclerotic heart disease of native coronary artery without angina pectoris: Secondary | ICD-10-CM | POA: Diagnosis not present

## 2024-01-09 DIAGNOSIS — Z800 Family history of malignant neoplasm of digestive organs: Secondary | ICD-10-CM | POA: Diagnosis not present

## 2024-01-09 DIAGNOSIS — I10 Essential (primary) hypertension: Secondary | ICD-10-CM | POA: Diagnosis not present

## 2024-01-09 DIAGNOSIS — I119 Hypertensive heart disease without heart failure: Secondary | ICD-10-CM | POA: Diagnosis not present

## 2024-01-09 DIAGNOSIS — Z79899 Other long term (current) drug therapy: Secondary | ICD-10-CM | POA: Insufficient documentation

## 2024-01-09 DIAGNOSIS — E1129 Type 2 diabetes mellitus with other diabetic kidney complication: Secondary | ICD-10-CM | POA: Insufficient documentation

## 2024-01-09 DIAGNOSIS — E7800 Pure hypercholesterolemia, unspecified: Secondary | ICD-10-CM | POA: Diagnosis not present

## 2024-01-09 DIAGNOSIS — C61 Malignant neoplasm of prostate: Secondary | ICD-10-CM | POA: Insufficient documentation

## 2024-01-09 DIAGNOSIS — Z7984 Long term (current) use of oral hypoglycemic drugs: Secondary | ICD-10-CM | POA: Diagnosis not present

## 2024-01-09 DIAGNOSIS — I350 Nonrheumatic aortic (valve) stenosis: Secondary | ICD-10-CM | POA: Diagnosis not present

## 2024-01-11 NOTE — Progress Notes (Addendum)
 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 male seen at the request of Dr. Watt.  Earlier this year, he was found to have an elevated PSA, of 5.28.  He was referred to see Dr. [name], and subsequently Dr. Watt, and ultimately underwent biopsy of the prostate in late October.  Pathology revealed Gleason 3+4 = 7 adenocarcinoma in 1 out of 12 specimens.  Four other specimens contained Gleason 6 adenocarcinoma.  He has been seen back by Dr. Watt, and his pathologic findings have been discussed.  The Decipher test has been ordered to assist in making treatment decisions.    PREVIOUS RADIATION THERAPY: No   PAST MEDICAL HISTORY:  Past Medical History:  Diagnosis Date   Aortic stenosis, severe 10/10/2020   Benign essential hypertension    Coronary artery disease involving native coronary artery of native heart without angina pectoris    Diabetes mellitus with renal complications (HCC)    History of alcohol abuse    Hypercholesterolemia    Hypertensive heart disease 08/18/2020   Onychomycosis    S/P aortic valve replacement with bioprosthetic valve 10/10/2020   Size 23 mm     Severe aortic stenosis    Tobacco abuse        PAST SURGICAL HISTORY: Past Surgical History:  Procedure Laterality Date   AORTIC VALVE REPLACEMENT N/A 10/10/2020   Procedure: AORTIC VALVE REPLACEMENT (AVR) WITH INSPIRIS AORTIC VALVE;  Surgeon: Lucas Dorise POUR, MD;  Location: [facility] OR;  Service: Open Heart Surgery;  Laterality: N/A;   NO PAST SURGERIES     RIGHT HEART CATH AND CORONARY/GRAFT ANGIOGRAPHY N/A 09/01/2020   Procedure: RIGHT HEART CATH  AND CORONARY/GRAFT ANGIOGRAPHY;  Surgeon: Verlin Lonni BIRCH, MD;  Location: [facility] INVASIVE CV LAB;  Service: Cardiovascular;  Laterality: N/A;   TEE WITHOUT CARDIOVERSION N/A 10/10/2020   Procedure: TRANSESOPHAGEAL ECHOCARDIOGRAM (TEE);  Surgeon: Lucas Dorise POUR, MD;  Location: [facility] OR;  Service: Open Heart Surgery;  Laterality: N/A;     FAMILY HISTORY:  Family History  Problem Relation Age of Onset   Alzheimer's disease Mother    Colon cancer Father    Diabetes Father    Hypertension Father      SOCIAL HISTORY:  reports that he has quit smoking. His smoking use included cigarettes. He started smoking about 55 years ago. He has a 104 pack-year smoking history. He has never used smokeless tobacco. He reports current alcohol use of about 35.0 standard drinks of alcohol per week. He reports current drug use. Drug: Marijuana.   ALLERGIES: Altace [ramipril], Hctz [hydrochlorothiazide], and Wellbutrin [bupropion]   MEDICATIONS:  Current Outpatient Medications  Medication Sig Dispense Refill   DENTA 5000 PLUS 1.1 % CREA dental cream Take by mouth.     JANUVIA 100 MG tablet Take 100 mg by mouth daily.     amLODipine (NORVASC) 5 MG tablet Take 5 mg by mouth daily.     amoxicillin  (AMOXIL ) 500 MG capsule Take 2000 mg 45 minutes prior to dental procedures. 20 capsule 2   aspirin  EC 81 MG tablet Take 81  mg by mouth daily. Swallow whole.     CINNAMON PO Take 1,500 mg by mouth in the morning. Currently taking 1200     JARDIANCE  25 MG TABS tablet Take 25 mg by mouth daily.     metoprolol  tartrate (LOPRESSOR ) 25 MG tablet Take 1 tablet (25 mg total) by mouth 2 (two) times daily. 180 tablet 3   Multiple Vitamin (MULTIVITAMIN WITH MINERALS) TABS tablet Take 1 tablet by mouth in the morning.     OZEMPIC, 1 MG/DOSE, 4 MG/3ML SOPN Inject 1 mg into the skin once a week.     rosuvastatin  (CRESTOR ) 5 MG tablet Take 5 mg by mouth at bedtime.     tamsulosin (FLOMAX) 0.4 MG CAPS capsule Take 0.4 mg by  mouth at bedtime.     triamcinolone cream (KENALOG) 0.1 % Apply 1 application topically 2 (two) times daily as needed (red skin/irritation).     No current facility-administered medications for this encounter.     REVIEW OF SYSTEMS: On review of systems, the patient reports that he is doing well overall.  He denies any chest pain, shortness of breath, cough, fevers, chills, night sweats, unintended weight changes.  He denies any bowel disturbances, and denies abdominal pain, nausea or vomiting.  He is bothered by frequent nocturia, sometimes getting up as much as 5 or 6 times each night.  He denies any new musculoskeletal or joint aches or pains. A complete review of systems is obtained and is otherwise negative.     PHYSICAL EXAM:  Wt Readings from Last 3 Encounters:  01/09/24 139 lb 3.2 oz (63.1 kg)  03/23/23 143 lb 9.6 oz (65.1 kg)  01/28/22 149 lb (67.6 kg)   Temp Readings from Last 3 Encounters:  01/09/24 (!) 97.5 F (36.4 C) (Oral)  10/15/20 (!) 97.5 F (36.4 C) (Oral)  10/08/20 98.2 F (36.8 C) (Oral)   BP Readings from Last 3 Encounters:  01/09/24 (!) 139/93  03/23/23 130/74  01/28/22 124/78   Pulse Readings from Last 3 Encounters:  01/09/24 (!) 58  03/23/23 65  01/28/22 60   Pain Assessment Pain Score: 0-No pain/10  In general this is a well appearing male in no acute distress.  He's alert and oriented x4 and appropriate throughout the examination. Cardiopulmonary assessment is negative for acute distress and he exhibits normal effort.     ECOG = 0  0 - Asymptomatic (Fully active, able to carry on all predisease activities without restriction)  1 - Symptomatic but completely ambulatory (Restricted in physically strenuous activity but ambulatory and able to carry out work of a light or sedentary nature. For example, light housework, office work)  2 - Symptomatic, <50% in bed during the day (Ambulatory and capable of all self care but unable to carry out any  work activities. Up and about more than 50% of waking hours)  3 - Symptomatic, >50% in bed, but not bedbound (Capable of only limited self-care, confined to bed or chair 50% or more of waking hours)  4 - Bedbound (Completely disabled. Cannot carry on any self-care. Totally confined to bed or chair)  5 - Death   Raylene MM, Creech RH, Tormey DC, et al. [phone]). Toxicity and response criteria of the [facility]. Am. DOROTHA Bridges. Oncol. 5 (6): 649-55    IMPRESSION/PLAN: 1.  The patient is a 68 year old male with Gleason 3+4= 7 adenocarcinoma of the prostate.  I explained that his Gleason score, PSA value, and number of biopsy cores  involved places him in the favorable intermediate risk category.  I discussed treatment options with him and his wife at length, specifically reviewing current NCCN guidelines for favorable intermediate risk prostate cancer.  We discussed active surveillance, radical prostatectomy, brachytherapy, and external beam radiation.  Of note, he does not seem inclined to pursue radical prostatectomy at this time.  We primarily discussed external beam radiation, versus active surveillance.  After an extended discussion of his treatment options, as well as the potential side effects of radiation, he is somewhat unsure how he wishes to proceed; he wishes to await the results of his Decipher testing before arriving at a decision.  I have asked him to contact me once he has heard back from Dr. Maynard office regarding the test results.  I also encouraged him to contact me in the interim with any questions or concerns he may have.  In a visit lasting 60 minutes, greater than 50% of the time was spent face to face discussing the patient's condition, in preparation for the discussion, and coordinating the patient's care.    Gavin Telford A. Jomarie, MD   **Disclaimer: This note was dictated with voice recognition software. Similar sounding words can inadvertently be transcribed  and this note may contain transcription errors which may not have been corrected upon publication of note.**  "

## 2024-01-31 ENCOUNTER — Ambulatory Visit: Attending: Cardiology

## 2024-01-31 DIAGNOSIS — Z952 Presence of prosthetic heart valve: Secondary | ICD-10-CM | POA: Diagnosis not present

## 2024-02-01 LAB — ECHOCARDIOGRAM COMPLETE
AR max vel: 3.13 cm2
AV Area VTI: 3.37 cm2
AV Area mean vel: 3.25 cm2
AV Mean grad: 1.2 mmHg
AV Peak grad: 2 mmHg
Ao pk vel: 0.71 m/s
Area-P 1/2: 1.99 cm2
MV VTI: 1.32 cm2
S' Lateral: 2.3 cm

## 2024-02-02 ENCOUNTER — Encounter: Payer: Self-pay | Admitting: Cardiology

## 2024-02-20 ENCOUNTER — Ambulatory Visit
Admission: RE | Admit: 2024-02-20 | Discharge: 2024-02-20 | Disposition: A | Discharge status: Home / Self Care | Source: Ambulatory Visit | Attending: Radiation Oncology | Admitting: Radiation Oncology

## 2024-02-20 ENCOUNTER — Ambulatory Visit
Admission: RE | Admit: 2024-02-20 | Discharge: 2024-02-20 | Disposition: A | Source: Ambulatory Visit | Attending: Radiation Oncology | Admitting: Radiation Oncology

## 2024-02-20 DIAGNOSIS — C61 Malignant neoplasm of prostate: Secondary | ICD-10-CM | POA: Diagnosis present

## 2024-02-20 NOTE — Addendum Note (Signed)
 Encounter addended by: Jomarie Agent, MD on: 02/20/2024 4:29 PM  Actions taken: Clinical Note Signed

## 2024-02-22 DIAGNOSIS — C61 Malignant neoplasm of prostate: Secondary | ICD-10-CM | POA: Diagnosis not present

## 2024-03-01 ENCOUNTER — Ambulatory Visit
Admission: RE | Admit: 2024-03-01 | Discharge: 2024-03-01 | Disposition: A | Discharge status: Home / Self Care | Source: Ambulatory Visit | Attending: Radiation Oncology | Admitting: Radiation Oncology

## 2024-03-01 ENCOUNTER — Other Ambulatory Visit: Payer: Self-pay

## 2024-03-01 DIAGNOSIS — C61 Malignant neoplasm of prostate: Secondary | ICD-10-CM | POA: Insufficient documentation

## 2024-03-01 DIAGNOSIS — Z510 Encounter for antineoplastic radiation therapy: Secondary | ICD-10-CM | POA: Insufficient documentation

## 2024-03-01 LAB — RAD ONC ARIA SESSION SUMMARY
Course Elapsed Days: 0
Plan Fractions Treated to Date: 1
Plan Prescribed Dose Per Fraction: 2.5 Gy
Plan Total Fractions Prescribed: 28
Plan Total Prescribed Dose: 70 Gy
Reference Point Dosage Given to Date: 2.5 Gy
Reference Point Session Dosage Given: 2.5 Gy
Session Number: 1

## 2024-03-02 ENCOUNTER — Other Ambulatory Visit: Payer: Self-pay

## 2024-03-02 ENCOUNTER — Ambulatory Visit
Admission: RE | Admit: 2024-03-02 | Discharge: 2024-03-02 | Disposition: A | Source: Ambulatory Visit | Attending: Radiation Oncology

## 2024-03-02 DIAGNOSIS — Z510 Encounter for antineoplastic radiation therapy: Secondary | ICD-10-CM | POA: Diagnosis not present

## 2024-03-02 LAB — RAD ONC ARIA SESSION SUMMARY
Course Elapsed Days: 1
Plan Fractions Treated to Date: 2
Plan Prescribed Dose Per Fraction: 2.5 Gy
Plan Total Fractions Prescribed: 28
Plan Total Prescribed Dose: 70 Gy
Reference Point Dosage Given to Date: 5 Gy
Reference Point Session Dosage Given: 2.5 Gy
Session Number: 2

## 2024-03-05 ENCOUNTER — Other Ambulatory Visit: Payer: Self-pay

## 2024-03-05 ENCOUNTER — Ambulatory Visit
Admission: RE | Admit: 2024-03-05 | Discharge: 2024-03-05 | Disposition: A | Source: Ambulatory Visit | Attending: Radiation Oncology

## 2024-03-05 ENCOUNTER — Telehealth: Payer: Self-pay

## 2024-03-05 DIAGNOSIS — Z510 Encounter for antineoplastic radiation therapy: Secondary | ICD-10-CM | POA: Diagnosis not present

## 2024-03-05 LAB — RAD ONC ARIA SESSION SUMMARY
Course Elapsed Days: 4
Plan Fractions Treated to Date: 3
Plan Prescribed Dose Per Fraction: 2.5 Gy
Plan Total Fractions Prescribed: 28
Plan Total Prescribed Dose: 70 Gy
Reference Point Dosage Given to Date: 7.5 Gy
Reference Point Session Dosage Given: 2.5 Gy
Session Number: 3

## 2024-03-05 NOTE — Telephone Encounter (Signed)
-----   Message from Norleen Seltzer, MD sent at 03/05/2024 10:39 AM EST ----- Regarding: RE: Flomax New script sent. ----- Message ----- From: Sheron Falling, RN Sent: 03/05/2024  10:06 AM EST To: Norleen Seltzer, MD Subject: Flomax                                         Patient stated that he is out of his flomax and is needing a new prescription.  [name] told us  that you had increased his dose to BID, please verify.

## 2024-03-06 ENCOUNTER — Ambulatory Visit
Admission: RE | Admit: 2024-03-06 | Discharge: 2024-03-06 | Disposition: A | Source: Ambulatory Visit | Attending: Radiation Oncology | Admitting: Radiation Oncology

## 2024-03-06 ENCOUNTER — Other Ambulatory Visit: Payer: Self-pay

## 2024-03-06 ENCOUNTER — Ambulatory Visit
Admission: RE | Admit: 2024-03-06 | Discharge: 2024-03-06 | Disposition: A | Source: Ambulatory Visit | Attending: Radiation Oncology

## 2024-03-06 DIAGNOSIS — Z510 Encounter for antineoplastic radiation therapy: Secondary | ICD-10-CM | POA: Diagnosis not present

## 2024-03-06 LAB — RAD ONC ARIA SESSION SUMMARY
Course Elapsed Days: 5
Plan Fractions Treated to Date: 4
Plan Prescribed Dose Per Fraction: 2.5 Gy
Plan Total Fractions Prescribed: 28
Plan Total Prescribed Dose: 70 Gy
Reference Point Dosage Given to Date: 10 Gy
Reference Point Session Dosage Given: 2.5 Gy
Session Number: 4

## 2024-03-07 ENCOUNTER — Other Ambulatory Visit: Payer: Self-pay | Admitting: Cardiology

## 2024-03-07 ENCOUNTER — Other Ambulatory Visit: Payer: Self-pay

## 2024-03-07 ENCOUNTER — Ambulatory Visit
Admission: RE | Admit: 2024-03-07 | Discharge: 2024-03-07 | Disposition: A | Discharge status: Home / Self Care | Source: Ambulatory Visit | Attending: Radiation Oncology | Admitting: Radiation Oncology

## 2024-03-07 DIAGNOSIS — Z510 Encounter for antineoplastic radiation therapy: Secondary | ICD-10-CM | POA: Diagnosis not present

## 2024-03-07 LAB — RAD ONC ARIA SESSION SUMMARY
Course Elapsed Days: 6
Plan Fractions Treated to Date: 5
Plan Prescribed Dose Per Fraction: 2.5 Gy
Plan Total Fractions Prescribed: 28
Plan Total Prescribed Dose: 70 Gy
Reference Point Dosage Given to Date: 12.5 Gy
Reference Point Session Dosage Given: 2.5 Gy
Session Number: 5

## 2024-03-08 ENCOUNTER — Ambulatory Visit
Admission: RE | Admit: 2024-03-08 | Discharge: 2024-03-08 | Disposition: A | Source: Ambulatory Visit | Attending: Radiation Oncology

## 2024-03-08 ENCOUNTER — Other Ambulatory Visit: Payer: Self-pay

## 2024-03-08 DIAGNOSIS — Z510 Encounter for antineoplastic radiation therapy: Secondary | ICD-10-CM | POA: Diagnosis not present

## 2024-03-08 LAB — RAD ONC ARIA SESSION SUMMARY
Course Elapsed Days: 7
Plan Fractions Treated to Date: 6
Plan Prescribed Dose Per Fraction: 2.5 Gy
Plan Total Fractions Prescribed: 28
Plan Total Prescribed Dose: 70 Gy
Reference Point Dosage Given to Date: 15 Gy
Reference Point Session Dosage Given: 2.5 Gy
Session Number: 6

## 2024-03-09 ENCOUNTER — Other Ambulatory Visit: Payer: Self-pay

## 2024-03-09 ENCOUNTER — Ambulatory Visit
Admission: RE | Admit: 2024-03-09 | Discharge: 2024-03-09 | Disposition: A | Discharge status: Home / Self Care | Source: Ambulatory Visit | Attending: Radiation Oncology | Admitting: Radiation Oncology

## 2024-03-09 DIAGNOSIS — Z510 Encounter for antineoplastic radiation therapy: Secondary | ICD-10-CM | POA: Diagnosis not present

## 2024-03-09 LAB — RAD ONC ARIA SESSION SUMMARY
Course Elapsed Days: 8
Plan Fractions Treated to Date: 7
Plan Prescribed Dose Per Fraction: 2.5 Gy
Plan Total Fractions Prescribed: 28
Plan Total Prescribed Dose: 70 Gy
Reference Point Dosage Given to Date: 17.5 Gy
Reference Point Session Dosage Given: 2.5 Gy
Session Number: 7

## 2024-03-12 ENCOUNTER — Other Ambulatory Visit: Payer: Self-pay

## 2024-03-12 ENCOUNTER — Ambulatory Visit
Admission: RE | Admit: 2024-03-12 | Discharge: 2024-03-12 | Disposition: A | Source: Ambulatory Visit | Attending: Radiation Oncology

## 2024-03-12 DIAGNOSIS — Z510 Encounter for antineoplastic radiation therapy: Secondary | ICD-10-CM | POA: Diagnosis not present

## 2024-03-12 LAB — RAD ONC ARIA SESSION SUMMARY
Course Elapsed Days: 11
Plan Fractions Treated to Date: 8
Plan Prescribed Dose Per Fraction: 2.5 Gy
Plan Total Fractions Prescribed: 28
Plan Total Prescribed Dose: 70 Gy
Reference Point Dosage Given to Date: 20 Gy
Reference Point Session Dosage Given: 2.5 Gy
Session Number: 8

## 2024-03-13 ENCOUNTER — Ambulatory Visit
Admission: RE | Admit: 2024-03-13 | Discharge: 2024-03-13 | Disposition: A | Source: Ambulatory Visit | Attending: Radiation Oncology | Admitting: Radiation Oncology

## 2024-03-13 ENCOUNTER — Ambulatory Visit
Admission: RE | Admit: 2024-03-13 | Discharge: 2024-03-13 | Disposition: A | Source: Ambulatory Visit | Attending: Radiation Oncology

## 2024-03-13 ENCOUNTER — Other Ambulatory Visit: Payer: Self-pay

## 2024-03-13 DIAGNOSIS — Z510 Encounter for antineoplastic radiation therapy: Secondary | ICD-10-CM | POA: Diagnosis not present

## 2024-03-13 LAB — RAD ONC ARIA SESSION SUMMARY
Course Elapsed Days: 12
Plan Fractions Treated to Date: 9
Plan Prescribed Dose Per Fraction: 2.5 Gy
Plan Total Fractions Prescribed: 28
Plan Total Prescribed Dose: 70 Gy
Reference Point Dosage Given to Date: 22.5 Gy
Reference Point Session Dosage Given: 2.5 Gy
Session Number: 9

## 2024-03-14 ENCOUNTER — Ambulatory Visit
Admission: RE | Admit: 2024-03-14 | Discharge: 2024-03-14 | Disposition: A | Source: Ambulatory Visit | Attending: Radiation Oncology

## 2024-03-14 ENCOUNTER — Other Ambulatory Visit: Payer: Self-pay

## 2024-03-14 DIAGNOSIS — Z510 Encounter for antineoplastic radiation therapy: Secondary | ICD-10-CM | POA: Diagnosis not present

## 2024-03-14 LAB — RAD ONC ARIA SESSION SUMMARY
Course Elapsed Days: 13
Plan Fractions Treated to Date: 10
Plan Prescribed Dose Per Fraction: 2.5 Gy
Plan Total Fractions Prescribed: 28
Plan Total Prescribed Dose: 70 Gy
Reference Point Dosage Given to Date: 25 Gy
Reference Point Session Dosage Given: 2.5 Gy
Session Number: 10

## 2024-03-15 ENCOUNTER — Ambulatory Visit
Admission: RE | Admit: 2024-03-15 | Discharge: 2024-03-15 | Disposition: A | Discharge status: Home / Self Care | Source: Ambulatory Visit | Attending: Radiation Oncology | Admitting: Radiation Oncology

## 2024-03-15 ENCOUNTER — Other Ambulatory Visit: Payer: Self-pay

## 2024-03-15 DIAGNOSIS — Z510 Encounter for antineoplastic radiation therapy: Secondary | ICD-10-CM | POA: Diagnosis not present

## 2024-03-15 LAB — RAD ONC ARIA SESSION SUMMARY
Course Elapsed Days: 14
Plan Fractions Treated to Date: 11
Plan Prescribed Dose Per Fraction: 2.5 Gy
Plan Total Fractions Prescribed: 28
Plan Total Prescribed Dose: 70 Gy
Reference Point Dosage Given to Date: 27.5 Gy
Reference Point Session Dosage Given: 2.5 Gy
Session Number: 11

## 2024-03-16 ENCOUNTER — Other Ambulatory Visit: Payer: Self-pay

## 2024-03-16 ENCOUNTER — Ambulatory Visit
Admission: RE | Admit: 2024-03-16 | Discharge: 2024-03-16 | Disposition: A | Source: Ambulatory Visit | Attending: Radiation Oncology

## 2024-03-16 DIAGNOSIS — Z510 Encounter for antineoplastic radiation therapy: Secondary | ICD-10-CM | POA: Diagnosis not present

## 2024-03-16 LAB — RAD ONC ARIA SESSION SUMMARY
Course Elapsed Days: 15
Plan Fractions Treated to Date: 12
Plan Prescribed Dose Per Fraction: 2.5 Gy
Plan Total Fractions Prescribed: 28
Plan Total Prescribed Dose: 70 Gy
Reference Point Dosage Given to Date: 30 Gy
Reference Point Session Dosage Given: 2.5 Gy
Session Number: 12

## 2024-03-19 ENCOUNTER — Ambulatory Visit

## 2024-03-20 ENCOUNTER — Other Ambulatory Visit: Payer: Self-pay

## 2024-03-20 ENCOUNTER — Ambulatory Visit
Admission: RE | Admit: 2024-03-20 | Discharge: 2024-03-20 | Disposition: A | Source: Ambulatory Visit | Attending: Radiation Oncology | Admitting: Radiation Oncology

## 2024-03-20 ENCOUNTER — Ambulatory Visit
Admission: RE | Admit: 2024-03-20 | Discharge: 2024-03-20 | Disposition: A | Source: Ambulatory Visit | Attending: Radiation Oncology

## 2024-03-20 DIAGNOSIS — Z510 Encounter for antineoplastic radiation therapy: Secondary | ICD-10-CM | POA: Diagnosis not present

## 2024-03-20 LAB — RAD ONC ARIA SESSION SUMMARY
Course Elapsed Days: 19
Plan Fractions Treated to Date: 13
Plan Prescribed Dose Per Fraction: 2.5 Gy
Plan Total Fractions Prescribed: 28
Plan Total Prescribed Dose: 70 Gy
Reference Point Dosage Given to Date: 32.5 Gy
Reference Point Session Dosage Given: 2.5 Gy
Session Number: 13

## 2024-03-21 ENCOUNTER — Other Ambulatory Visit: Payer: Self-pay

## 2024-03-21 ENCOUNTER — Ambulatory Visit
Admission: RE | Admit: 2024-03-21 | Discharge: 2024-03-21 | Disposition: A | Source: Ambulatory Visit | Attending: Radiation Oncology

## 2024-03-21 DIAGNOSIS — Z510 Encounter for antineoplastic radiation therapy: Secondary | ICD-10-CM | POA: Diagnosis not present

## 2024-03-21 LAB — RAD ONC ARIA SESSION SUMMARY
Course Elapsed Days: 20
Plan Fractions Treated to Date: 14
Plan Prescribed Dose Per Fraction: 2.5 Gy
Plan Total Fractions Prescribed: 28
Plan Total Prescribed Dose: 70 Gy
Reference Point Dosage Given to Date: 35 Gy
Reference Point Session Dosage Given: 2.5 Gy
Session Number: 14

## 2024-03-22 ENCOUNTER — Ambulatory Visit
Admission: RE | Admit: 2024-03-22 | Discharge: 2024-03-22 | Disposition: A | Discharge status: Home / Self Care | Source: Ambulatory Visit | Attending: Radiation Oncology | Admitting: Radiation Oncology

## 2024-03-22 ENCOUNTER — Other Ambulatory Visit: Payer: Self-pay

## 2024-03-22 DIAGNOSIS — Z510 Encounter for antineoplastic radiation therapy: Secondary | ICD-10-CM | POA: Diagnosis not present

## 2024-03-22 LAB — RAD ONC ARIA SESSION SUMMARY
Course Elapsed Days: 21
Plan Fractions Treated to Date: 15
Plan Prescribed Dose Per Fraction: 2.5 Gy
Plan Total Fractions Prescribed: 28
Plan Total Prescribed Dose: 70 Gy
Reference Point Dosage Given to Date: 37.5 Gy
Reference Point Session Dosage Given: 2.5 Gy
Session Number: 15

## 2024-03-23 ENCOUNTER — Ambulatory Visit: Admitting: Cardiology

## 2024-03-23 ENCOUNTER — Ambulatory Visit
Admission: RE | Admit: 2024-03-23 | Discharge: 2024-03-23 | Disposition: A | Source: Ambulatory Visit | Attending: Radiation Oncology

## 2024-03-23 ENCOUNTER — Other Ambulatory Visit: Payer: Self-pay

## 2024-03-23 DIAGNOSIS — Z510 Encounter for antineoplastic radiation therapy: Secondary | ICD-10-CM | POA: Diagnosis not present

## 2024-03-23 LAB — RAD ONC ARIA SESSION SUMMARY
Course Elapsed Days: 22
Plan Fractions Treated to Date: 16
Plan Prescribed Dose Per Fraction: 2.5 Gy
Plan Total Fractions Prescribed: 28
Plan Total Prescribed Dose: 70 Gy
Reference Point Dosage Given to Date: 40 Gy
Reference Point Session Dosage Given: 2.5 Gy
Session Number: 16

## 2024-03-26 ENCOUNTER — Ambulatory Visit

## 2024-03-27 ENCOUNTER — Ambulatory Visit
Admission: RE | Admit: 2024-03-27 | Discharge: 2024-03-27 | Disposition: A | Source: Ambulatory Visit | Attending: Radiation Oncology

## 2024-03-27 ENCOUNTER — Ambulatory Visit
Admission: RE | Admit: 2024-03-27 | Discharge: 2024-03-27 | Disposition: A | Source: Ambulatory Visit | Attending: Radiation Oncology | Admitting: Radiation Oncology

## 2024-03-27 ENCOUNTER — Other Ambulatory Visit: Payer: Self-pay

## 2024-03-27 LAB — RAD ONC ARIA SESSION SUMMARY
Course Elapsed Days: 26
Plan Fractions Treated to Date: 17
Plan Prescribed Dose Per Fraction: 2.5 Gy
Plan Total Fractions Prescribed: 28
Plan Total Prescribed Dose: 70 Gy
Reference Point Dosage Given to Date: 42.5 Gy
Reference Point Session Dosage Given: 2.5 Gy
Session Number: 17

## 2024-03-28 ENCOUNTER — Other Ambulatory Visit: Payer: Self-pay

## 2024-03-28 ENCOUNTER — Ambulatory Visit
Admission: RE | Admit: 2024-03-28 | Discharge: 2024-03-28 | Disposition: A | Source: Ambulatory Visit | Attending: Radiation Oncology

## 2024-03-28 LAB — RAD ONC ARIA SESSION SUMMARY
Course Elapsed Days: 27
Plan Fractions Treated to Date: 18
Plan Prescribed Dose Per Fraction: 2.5 Gy
Plan Total Fractions Prescribed: 28
Plan Total Prescribed Dose: 70 Gy
Reference Point Dosage Given to Date: 45 Gy
Reference Point Session Dosage Given: 2.5 Gy
Session Number: 18

## 2024-03-29 ENCOUNTER — Other Ambulatory Visit: Payer: Self-pay

## 2024-03-29 ENCOUNTER — Ambulatory Visit
Admission: RE | Admit: 2024-03-29 | Discharge: 2024-03-29 | Disposition: A | Source: Ambulatory Visit | Attending: Radiation Oncology

## 2024-03-29 LAB — RAD ONC ARIA SESSION SUMMARY
Course Elapsed Days: 28
Plan Fractions Treated to Date: 19
Plan Prescribed Dose Per Fraction: 2.5 Gy
Plan Total Fractions Prescribed: 28
Plan Total Prescribed Dose: 70 Gy
Reference Point Dosage Given to Date: 47.5 Gy
Reference Point Session Dosage Given: 2.5 Gy
Session Number: 19

## 2024-03-30 ENCOUNTER — Other Ambulatory Visit: Payer: Self-pay

## 2024-03-30 ENCOUNTER — Ambulatory Visit: Admission: RE | Admit: 2024-03-30 | Source: Ambulatory Visit

## 2024-03-30 LAB — RAD ONC ARIA SESSION SUMMARY
Course Elapsed Days: 29
Plan Fractions Treated to Date: 20
Plan Prescribed Dose Per Fraction: 2.5 Gy
Plan Total Fractions Prescribed: 28
Plan Total Prescribed Dose: 70 Gy
Reference Point Dosage Given to Date: 50 Gy
Reference Point Session Dosage Given: 2.5 Gy
Session Number: 20

## 2024-04-02 ENCOUNTER — Ambulatory Visit

## 2024-04-03 ENCOUNTER — Ambulatory Visit: Admitting: Radiation Oncology

## 2024-04-03 ENCOUNTER — Ambulatory Visit

## 2024-04-04 ENCOUNTER — Ambulatory Visit

## 2024-04-05 ENCOUNTER — Ambulatory Visit

## 2024-04-06 ENCOUNTER — Ambulatory Visit

## 2024-04-09 ENCOUNTER — Ambulatory Visit

## 2024-04-10 ENCOUNTER — Ambulatory Visit

## 2024-04-10 ENCOUNTER — Ambulatory Visit: Admitting: Radiation Oncology

## 2024-04-11 ENCOUNTER — Ambulatory Visit

## 2024-04-17 ENCOUNTER — Ambulatory Visit: Admitting: Cardiology
